# Patient Record
Sex: Male | Born: 1955 | Race: Black or African American | Hispanic: No | Marital: Married | Smoking: Former smoker
Health system: Southern US, Community
[De-identification: ages and names within clinical notes are randomized; demographics above are authoritative.]

## PROBLEM LIST (undated history)

## (undated) DIAGNOSIS — I10 Essential (primary) hypertension: Secondary | ICD-10-CM

## (undated) HISTORY — PX: APPENDECTOMY: SHX54

---

## 1998-12-24 ENCOUNTER — Encounter: Payer: Self-pay | Admitting: Cardiology

## 1998-12-24 ENCOUNTER — Ambulatory Visit (HOSPITAL_COMMUNITY): Admission: RE | Admit: 1998-12-24 | Discharge: 1998-12-24 | Payer: Self-pay | Admitting: Cardiology

## 2001-09-20 ENCOUNTER — Emergency Department (HOSPITAL_COMMUNITY): Admission: EM | Admit: 2001-09-20 | Discharge: 2001-09-21 | Payer: Self-pay | Admitting: Emergency Medicine

## 2001-10-19 ENCOUNTER — Ambulatory Visit: Admission: RE | Admit: 2001-10-19 | Discharge: 2001-10-29 | Payer: Self-pay | Admitting: Radiation Oncology

## 2001-11-02 ENCOUNTER — Emergency Department (HOSPITAL_COMMUNITY): Admission: EM | Admit: 2001-11-02 | Discharge: 2001-11-03 | Payer: Self-pay | Admitting: Emergency Medicine

## 2002-11-07 ENCOUNTER — Ambulatory Visit (HOSPITAL_COMMUNITY): Admission: RE | Admit: 2002-11-07 | Discharge: 2002-11-07 | Payer: Self-pay | Admitting: Family Medicine

## 2002-11-07 ENCOUNTER — Encounter: Payer: Self-pay | Admitting: Family Medicine

## 2002-12-26 ENCOUNTER — Ambulatory Visit (HOSPITAL_COMMUNITY): Admission: RE | Admit: 2002-12-26 | Discharge: 2002-12-26 | Payer: Self-pay | Admitting: *Deleted

## 2005-02-15 ENCOUNTER — Ambulatory Visit: Admission: RE | Admit: 2005-02-15 | Discharge: 2005-04-06 | Payer: Self-pay | Admitting: Radiation Oncology

## 2005-03-17 ENCOUNTER — Encounter: Admission: RE | Admit: 2005-03-17 | Discharge: 2005-03-17 | Payer: Self-pay | Admitting: Surgery

## 2005-03-18 ENCOUNTER — Ambulatory Visit (HOSPITAL_BASED_OUTPATIENT_CLINIC_OR_DEPARTMENT_OTHER): Admission: RE | Admit: 2005-03-18 | Discharge: 2005-03-18 | Payer: Self-pay | Admitting: Surgery

## 2006-04-06 ENCOUNTER — Emergency Department (HOSPITAL_COMMUNITY): Admission: EM | Admit: 2006-04-06 | Discharge: 2006-04-07 | Payer: Self-pay | Admitting: Emergency Medicine

## 2011-01-10 ENCOUNTER — Other Ambulatory Visit: Payer: Self-pay | Admitting: Gastroenterology

## 2011-05-01 ENCOUNTER — Emergency Department (HOSPITAL_BASED_OUTPATIENT_CLINIC_OR_DEPARTMENT_OTHER)
Admission: EM | Admit: 2011-05-01 | Discharge: 2011-05-01 | Disposition: A | Discharge status: Home / Self Care | Payer: BC Managed Care – PPO | Attending: Emergency Medicine | Admitting: Emergency Medicine

## 2011-05-01 ENCOUNTER — Encounter (HOSPITAL_BASED_OUTPATIENT_CLINIC_OR_DEPARTMENT_OTHER): Payer: Self-pay | Admitting: *Deleted

## 2011-05-01 DIAGNOSIS — Z79899 Other long term (current) drug therapy: Secondary | ICD-10-CM | POA: Insufficient documentation

## 2011-05-01 DIAGNOSIS — R10819 Abdominal tenderness, unspecified site: Secondary | ICD-10-CM | POA: Insufficient documentation

## 2011-05-01 DIAGNOSIS — I10 Essential (primary) hypertension: Secondary | ICD-10-CM | POA: Insufficient documentation

## 2011-05-01 DIAGNOSIS — R7401 Elevation of levels of liver transaminase levels: Secondary | ICD-10-CM | POA: Insufficient documentation

## 2011-05-01 DIAGNOSIS — R197 Diarrhea, unspecified: Secondary | ICD-10-CM

## 2011-05-01 DIAGNOSIS — R1915 Other abnormal bowel sounds: Secondary | ICD-10-CM | POA: Insufficient documentation

## 2011-05-01 DIAGNOSIS — R7402 Elevation of levels of lactic acid dehydrogenase (LDH): Secondary | ICD-10-CM | POA: Insufficient documentation

## 2011-05-01 DIAGNOSIS — R109 Unspecified abdominal pain: Secondary | ICD-10-CM | POA: Insufficient documentation

## 2011-05-01 DIAGNOSIS — R748 Abnormal levels of other serum enzymes: Secondary | ICD-10-CM

## 2011-05-01 HISTORY — DX: Essential (primary) hypertension: I10

## 2011-05-01 LAB — COMPREHENSIVE METABOLIC PANEL
Alkaline Phosphatase: 126 U/L — ABNORMAL HIGH (ref 39–117)
BUN: 14 mg/dL (ref 6–23)
Chloride: 101 mEq/L (ref 96–112)
GFR calc Af Amer: >90 mL/min (ref >90)
Glucose, Bld: 111 mg/dL — ABNORMAL HIGH (ref 70–99)
Potassium: 3.6 mEq/L (ref 3.5–5.1)
Total Bilirubin: 0.4 mg/dL (ref 0.3–1.2)

## 2011-05-01 LAB — CLOSTRIDIUM DIFFICILE BY PCR: Toxigenic C. Difficile by PCR: NEGATIVE

## 2011-05-01 LAB — CBC
HCT: 45.4 % (ref 39.0–52.0)
Hemoglobin: 15.4 g/dL (ref 13.0–17.0)
WBC: 4.7 10*3/uL (ref 4.0–10.5)

## 2011-05-01 MED ORDER — SODIUM CHLORIDE 0.9 % IV SOLN: INTRAVENOUS | Status: DC

## 2011-05-01 MED ORDER — HYDROCODONE-ACETAMINOPHEN 5-500 MG PO TABS: 1.0000 | ORAL_TABLET | Freq: Four times a day (QID) | ORAL | Status: AC | PRN

## 2011-05-01 MED ORDER — MORPHINE SULFATE 4 MG/ML IJ SOLN
4.0000 mg | Freq: Once | INTRAMUSCULAR | Status: AC
  Administered 2011-05-01: 4 mg via INTRAVENOUS
  Filled 2011-05-01: qty 1

## 2011-05-01 MED ORDER — SODIUM CHLORIDE 0.9 % IV BOLUS (SEPSIS): 1000.0000 mL | Freq: Once | INTRAVENOUS | Status: DC

## 2011-05-01 MED ORDER — SODIUM CHLORIDE 0.9 % IV BOLUS (SEPSIS)
1000.0000 mL | Freq: Once | INTRAVENOUS | Status: AC
  Administered 2011-05-01: 1000 mL via INTRAVENOUS

## 2011-05-01 MED ORDER — ONDANSETRON HCL 4 MG/2ML IJ SOLN
4.0000 mg | Freq: Once | INTRAMUSCULAR | Status: AC
  Administered 2011-05-01: 4 mg via INTRAVENOUS
  Filled 2011-05-01: qty 2

## 2011-05-01 MED ORDER — LOPERAMIDE HCL 2 MG PO CAPS: 2.0000 mg | ORAL_CAPSULE | Freq: Four times a day (QID) | ORAL | Status: AC | PRN

## 2011-05-01 NOTE — ED Notes (Signed)
Pt presnts to ED today with diarrhea for the last 3 days that has steadily turned to cramping.  Pt has taken pepto about 3 hours ago with no relief in sx.

## 2011-05-01 NOTE — Discharge Instructions (Signed)
Diarrhea  Infections caused by germs (bacterial) or a virus commonly cause diarrhea. Your caregiver has determined that with time, rest and fluids, the diarrhea should improve. In general, eat normally while drinking more water than usual. Although water may prevent dehydration, it does not contain salt and minerals (electrolytes). Broths, weak tea without caffeine and oral rehydration solutions (ORS) replace fluids and electrolytes.  Small amounts of fluids should be taken frequently. Large amounts at one time may not be tolerated. Plain water may be harmful in infants and the elderly. Oral rehydrating solutions (ORS) are available at pharmacies and grocery stores. ORS replace water and important electrolytes in proper proportions. Sports drinks are not as effective as ORS and may be harmful due to sugars worsening diarrhea.  ORS is especially recommended for use in children with diarrhea. As a general guideline for children, replace any new fluid losses from diarrhea and/or vomiting with ORS as follows:  While correcting for dehydration, children should eat normally. However, foods high in sugar should be avoided because this may worsen diarrhea. Large amounts of carbonated soft drinks, juice, gelatin desserts and other highly sugared drinks should be avoided.  After correction of dehydration, other liquids that are appealing to the child may be added. Children should drink small amounts of fluids frequently and fluids should be increased as tolerated. Children should drink enough fluids to keep urine clear or pale yellow.  Adults should eat normally while drinking more fluids than usual. Drink small amounts of fluids frequently and increase as tolerated. Drink enough fluids to keep urine clear or pale yellow. Broths, weak decaffeinated tea, lemon lime soft drinks (allowed to go flat) and ORS replace fluids and electrolytes.  Avoid:  Carbonated drinks.  Juice.  Extremely hot or cold fluids.  Caffeine  drinks.  Fatty, greasy foods.  Alcohol.  Tobacco.  Too much intake of anything at one time.  Gelatin desserts.  Probiotics are active cultures of beneficial bacteria. They may lessen the amount and number of diarrheal stools in adults. Probiotics can be found in yogurt with active cultures and in supplements.  Wash hands well to avoid spreading bacteria and virus.  Anti-diarrheal medications are not recommended for infants and children.  Only take over-the-counter or prescription medicines for pain, discomfort or fever as directed by your caregiver. Do not give aspirin to children because it may cause Reye's Syndrome.  For adults, ask your caregiver if you should continue all prescribed and over-the-counter medicines.  If your caregiver has given you a follow-up appointment, it is very important to keep that appointment. Not keeping the appointment could result in a chronic or permanent injury, and disability. If there is any problem keeping the appointment, you must call back to this facility for assistance.  SEEK IMMEDIATE MEDICAL CARE IF:  You or your child is unable to keep fluids down or other symptoms or problems become worse in spite of treatment.  Vomiting or diarrhea develops and becomes persistent.  There is vomiting of blood or bile (green material).  There is blood in the stool or the stools are black and tarry.  There is no urine output in 6-8 hours or there is only a small amount of very dark urine.  Abdominal pain develops, increases or localizes.  You have a fever.  You or your child develops excessive weakness, dizziness, fainting or extreme thirst.  You or your child develops a rash, stiff neck, severe headache or become irritable or sleepy and difficult to awaken.  MAKE  SURE YOU:  Understand these instructions.  Will watch your condition.  Will get help right away if you are not doing well or get worse.

## 2011-05-01 NOTE — ED Provider Notes (Signed)
History     CSN: [id number]  Arrival date & time 05/01/11  6213   First MD Initiated Contact with Patient 05/01/11 0401      Chief Complaint  Patient presents with  . Diarrhea    (Consider location/radiation/quality/duration/timing/severity/associated sxs/prior treatment) The history is provided by the patient.   diarrhea for last 3 days. No known sick contacts. No recent travel. No recent antibiotics but has been prescribed some the last 2 weeks by a dermatologist, although patient states he did not take them.  No associated fevers or chills. No abdominal pain until today the patient developed cramping which is diffuse and intermittent. No associated nausea vomiting. Moderate severity. No history of same. No history of abdominal surgeries. Illness started with some cough and cold symptoms. No rash. No sore throat. No congestion or runny nose. No history of same  Past Medical History  Diagnosis Date  . Hypertension     History reviewed. No pertinent past surgical history.  No family history on file.  History  Substance Use Topics  . Smoking status: Never Smoker   . Smokeless tobacco: Not on file  . Alcohol Use: No      Review of Systems  Constitutional: Negative for fever and chills.  HENT: Negative for neck pain and neck stiffness.   Eyes: Negative for pain.  Respiratory: Negative for shortness of breath.   Cardiovascular: Negative for chest pain.  Gastrointestinal: Positive for diarrhea. Negative for abdominal pain and blood in stool.  Genitourinary: Negative for dysuria.  Musculoskeletal: Negative for back pain.  Skin: Negative for rash.  Neurological: Negative for headaches.  All other systems reviewed and are negative.    Allergies  Review of patient's allergies indicates no known allergies.  Home Medications   Current Outpatient Rx  Name Route Sig Dispense Refill  . LISINOPRIL-HYDROCHLOROTHIAZIDE 20-25 MG PO TABS Oral Take 1 tablet by mouth daily.    Marland Kitchen  HYDROCODONE-ACETAMINOPHEN 5-500 MG PO TABS Oral Take 1-2 tablets by mouth every 6 (six) hours as needed for pain. 15 tablet 0  . LOPERAMIDE HCL 2 MG PO CAPS Oral Take 1 capsule (2 mg total) by mouth 4 (four) times daily as needed for diarrhea or loose stools. 12 capsule 0    BP 127/81  Pulse 84  Temp(Src) 98.4 F (36.9 C) (Oral)  Resp 16  SpO2 99%  Physical Exam  Constitutional: He is oriented to person, place, and time. He appears well-developed and well-nourished.  HENT:  Head: Normocephalic and atraumatic.       mmm  Eyes: Conjunctivae and EOM are normal. Pupils are equal, round, and reactive to light.  Neck: Trachea normal. Neck supple. No thyromegaly present.  Cardiovascular: Normal rate, regular rhythm, S1 normal, S2 normal and normal pulses.     No systolic murmur is present   No diastolic murmur is present  Pulses:      Radial pulses are 2+ on the right side, and 2+ on the left side.  Pulmonary/Chest: Effort normal and breath sounds normal. He has no wheezes. He has no rhonchi. He has no rales. He exhibits no tenderness.  Abdominal: Soft. Normal appearance. There is no rebound, no guarding, no CVA tenderness and negative Murphy's sign.       Hyperactive bowel sounds with mild diffuse tenderness. No peritonitis  Musculoskeletal:       Gait intact without orthostatic dizziness  Neurological: He is alert and oriented to person, place, and time. He has normal strength. No cranial nerve  deficit or sensory deficit. GCS eye subscore is 4. GCS verbal subscore is 5. GCS motor subscore is 6.  Skin: Skin is warm and dry. No rash noted. He is not diaphoretic.  Psychiatric: His speech is normal.       Cooperative and appropriate    ED Course  Procedures (including critical care time)   Results for orders placed during the hospital encounter of 05/01/11  CBC      Component Value Range   WBC 4.7  4.0 - 10.5 (K/uL)   RBC 5.51  4.22 - 5.81 (MIL/uL)   Hemoglobin 15.4  13.0 - 17.0  (g/dL)   HCT 91.4  78.2 - 95.6 (%)   MCV 82.4  78.0 - 100.0 (fL)   MCH 27.9  26.0 - 34.0 (pg)   MCHC 33.9  30.0 - 36.0 (g/dL)   RDW 21.3  08.6 - 57.8 (%)   Platelets 199  150 - 400 (K/uL)  COMPREHENSIVE METABOLIC PANEL      Component Value Range   Sodium 135  135 - 145 (mEq/L)   Potassium 3.6  3.5 - 5.1 (mEq/L)   Chloride 101  96 - 112 (mEq/L)   CO2 24  19 - 32 (mEq/L)   Glucose, Bld 111 (*) 70 - 99 (mg/dL)   BUN 14  6 - 23 (mg/dL)   Creatinine, Ser 4.69  0.50 - 1.35 (mg/dL)   Calcium 62.9 (*) 8.4 - 10.5 (mg/dL)   Total Protein 8.0  6.0 - 8.3 (g/dL)   Albumin 3.8  3.5 - 5.2 (g/dL)   AST 528 (*) 0 - 37 (U/L)   ALT 106 (*) 0 - 53 (U/L)   Alkaline Phosphatase 126 (*) 39 - 117 (U/L)   Total Bilirubin 0.4  0.3 - 1.2 (mg/dL)   GFR calc non Af Amer 83 (*) >90 (mL/min)   GFR calc Af Amer >90  >90 (mL/min)      1. Diarrhea   2. Elevated liver enzymes    IV fluids. Zofran and morphine. On recheck is feeling much better and requesting to be discharged home. Patient states understanding C. difficile results not back.  He agrees to followup with primary care physician for results and to recheck elevated liver enzymes.   MDM   3 days of diarrheal illness with intermittent abdominal cramping improved with medications as above.  Noted elevated liver enzymes workup. No fevers or persistent abdominal pain. No peritonitis on exam or indication for emergenct imaging at this time. Plan close primary care followup. Pain medications provided. Vital signs within normal range and stable for discharge at this time.        Sunnie Nielsen, MD 05/01/11 [phone]

## 2016-03-04 ENCOUNTER — Emergency Department (HOSPITAL_BASED_OUTPATIENT_CLINIC_OR_DEPARTMENT_OTHER): Payer: BLUE CROSS/BLUE SHIELD

## 2016-03-04 ENCOUNTER — Inpatient Hospital Stay (HOSPITAL_BASED_OUTPATIENT_CLINIC_OR_DEPARTMENT_OTHER)
Admission: EM | Admit: 2016-03-04 | Discharge: 2016-03-06 | DRG: 165 | Disposition: A | Discharge status: Home / Self Care | Payer: BLUE CROSS/BLUE SHIELD | Attending: Pulmonary Disease | Admitting: Pulmonary Disease

## 2016-03-04 ENCOUNTER — Encounter (HOSPITAL_BASED_OUTPATIENT_CLINIC_OR_DEPARTMENT_OTHER): Payer: Self-pay | Admitting: *Deleted

## 2016-03-04 DIAGNOSIS — T17908A Unspecified foreign body in respiratory tract, part unspecified causing other injury, initial encounter: Secondary | ICD-10-CM | POA: Diagnosis not present

## 2016-03-04 DIAGNOSIS — Z79899 Other long term (current) drug therapy: Secondary | ICD-10-CM | POA: Diagnosis not present

## 2016-03-04 DIAGNOSIS — T17508A Unspecified foreign body in bronchus causing other injury, initial encounter: Secondary | ICD-10-CM | POA: Diagnosis not present

## 2016-03-04 DIAGNOSIS — T17898A Other foreign object in other parts of respiratory tract causing other injury, initial encounter: Principal | ICD-10-CM | POA: Diagnosis present

## 2016-03-04 DIAGNOSIS — Y92810 Car as the place of occurrence of the external cause: Secondary | ICD-10-CM

## 2016-03-04 DIAGNOSIS — Z87891 Personal history of nicotine dependence: Secondary | ICD-10-CM | POA: Diagnosis not present

## 2016-03-04 DIAGNOSIS — I10 Essential (primary) hypertension: Secondary | ICD-10-CM | POA: Diagnosis present

## 2016-03-04 DIAGNOSIS — R05 Cough: Secondary | ICD-10-CM | POA: Diagnosis present

## 2016-03-04 DIAGNOSIS — T17908S Unspecified foreign body in respiratory tract, part unspecified causing other injury, sequela: Secondary | ICD-10-CM | POA: Diagnosis not present

## 2016-03-04 DIAGNOSIS — X58XXXA Exposure to other specified factors, initial encounter: Secondary | ICD-10-CM | POA: Diagnosis present

## 2016-03-04 DIAGNOSIS — T17900A Unspecified foreign body in respiratory tract, part unspecified causing asphyxiation, initial encounter: Secondary | ICD-10-CM

## 2016-03-04 LAB — CBC WITH DIFFERENTIAL/PLATELET
BASOS PCT: 1 %
Basophils Absolute: 0 10*3/uL (ref 0.0–0.1)
EOS ABS: 0.3 10*3/uL (ref 0.0–0.7)
Eosinophils Relative: 4 %
HCT: 44.7 % (ref 39.0–52.0)
Hemoglobin: 15.2 g/dL (ref 13.0–17.0)
Lymphocytes Relative: 33 %
Lymphs Abs: 2.1 10*3/uL (ref 0.7–4.0)
MCH: 27.9 pg (ref 26.0–34.0)
MCHC: 34 g/dL (ref 30.0–36.0)
MCV: 82.2 fL (ref 78.0–100.0)
MONO ABS: 0.3 10*3/uL (ref 0.1–1.0)
MONOS PCT: 5 %
Neutro Abs: 3.7 10*3/uL (ref 1.7–7.7)
Neutrophils Relative %: 57 %
Platelets: 214 10*3/uL (ref 150–400)
RBC: 5.44 MIL/uL (ref 4.22–5.81)
RDW: 13.4 % (ref 11.5–15.5)
WBC: 6.4 10*3/uL (ref 4.0–10.5)

## 2016-03-04 LAB — COMPREHENSIVE METABOLIC PANEL
ALBUMIN: 4.3 g/dL (ref 3.5–5.0)
ALT: 29 U/L (ref 17–63)
ANION GAP: 8 (ref 5–15)
AST: 34 U/L (ref 15–41)
Alkaline Phosphatase: 72 U/L (ref 38–126)
BILIRUBIN TOTAL: 0.8 mg/dL (ref 0.3–1.2)
BUN: 13 mg/dL (ref 6–20)
CO2: 22 mmol/L (ref 22–32)
Calcium: 10.5 mg/dL — ABNORMAL HIGH (ref 8.9–10.3)
Chloride: 108 mmol/L (ref 101–111)
Creatinine, Ser: 0.83 mg/dL (ref 0.61–1.24)
GFR calc non Af Amer: >60 mL/min (ref >60)
GLUCOSE: 79 mg/dL (ref 65–99)
POTASSIUM: 3.7 mmol/L (ref 3.5–5.1)
SODIUM: 138 mmol/L (ref 135–145)
Total Protein: 8.3 g/dL — ABNORMAL HIGH (ref 6.5–8.1)

## 2016-03-04 LAB — APTT: APTT: 27 s (ref 24–36)

## 2016-03-04 LAB — PROTIME-INR
INR: 0.98
Prothrombin Time: 13 seconds (ref 11.4–15.2)

## 2016-03-04 NOTE — ED Provider Notes (Signed)
[facility] Provider Note   CSN: [id number] Arrival date & time: 03/04/16  1730     History   Chief Complaint Chief Complaint  Patient presents with  . Swallowed Foreign Body    HPI Joel [name] is a 61 y.o. male.  HPI Patient states that at roughly noon today he had a finishing nail in his mouth and started to cough and accidentally ingested the nail. Denies any pain in his mouth, throat, chest or abdomen. He's had no difficulty breathing or wheezing. Denies any blood in his stool. Past Medical History:  Diagnosis Date  . Hypertension     Patient Active Problem List   Diagnosis Date Noted  . Inhaled foreign object 03/04/2016    Past Surgical History:  Procedure Laterality Date  . APPENDECTOMY         Home Medications    Prior to Admission medications   Medication Sig Start Date End Date Taking? Authorizing Provider  lisinopril-hydrochlorothiazide (PRINZIDE,ZESTORETIC) 20-25 MG per tablet Take 1 tablet by mouth daily.    Historical Provider, MD    Family History No family history on file.  Social History Social History  Substance Use Topics  . Smoking status: Never Smoker  . Smokeless tobacco: Never Used  . Alcohol use No     Allergies   Patient has no known allergies.   Review of Systems Review of Systems  Constitutional: Negative for chills and fever.  HENT: Negative for sore throat, trouble swallowing and voice change.   Respiratory: Negative for cough, shortness of breath and wheezing.   Cardiovascular: Negative for chest pain.  Gastrointestinal: Negative for abdominal pain, nausea and vomiting.  Musculoskeletal: Negative for back pain and neck pain.  Skin: Negative for rash and wound.  Neurological: Negative for dizziness, weakness, light-headedness, numbness and headaches.  All other systems reviewed and are negative.    Physical Exam Updated Vital Signs BP 125/92 (BP Location: Right Arm)   Pulse 60   Temp 98.2 F  (36.8 C) (Oral)   Resp 14   Ht 5\' 10"  (1.778 m)   Wt 270 lb (122.5 kg)   SpO2 96%   BMI 38.74 kg/m   Physical Exam  Constitutional: He is oriented to person, place, and time. He appears well-developed and well-nourished. No distress.  HENT:  Head: Normocephalic and atraumatic.  Mouth/Throat: Oropharynx is clear and moist. No oropharyngeal exudate.  No obvious intraoral or posterior oropharyngeal injury or foreign body noted  Eyes: EOM are normal. Pupils are equal, round, and reactive to light.  Neck: Normal range of motion. Neck supple.  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  Cardiovascular: Normal rate and regular rhythm.  Exam reveals no gallop and no friction rub.   No murmur heard. Pulmonary/Chest: Effort normal and breath sounds normal. No stridor. No respiratory distress. He has no wheezes. He has no rales. He exhibits no tenderness.  Abdominal: Soft. Bowel sounds are normal. There is no tenderness. There is no rebound and no guarding.  Musculoskeletal: Normal range of motion. He exhibits no edema or tenderness.  Neurological: He is alert and oriented to person, place, and time.  Moves all extremities without deficit. Sensation intact.  Skin: Skin is warm and dry. No rash noted. No erythema.  Patient has with multiple nontender soft tissue masses on chest and abdomen. No erythema or warmth.  Psychiatric: He has a normal mood and affect. His behavior is normal.  Nursing note and vitals reviewed.    ED  Treatments / Results  Labs (all labs ordered are listed, but only abnormal results are displayed) Labs Reviewed  CBC WITH DIFFERENTIAL/PLATELET  COMPREHENSIVE METABOLIC PANEL  PROTIME-INR  APTT    EKG  EKG Interpretation None       Radiology Dg Chest 1 View  Result Date: 03/04/2016 CLINICAL DATA:  Accidentally inhaled nail earlier today. Initial encounter. EXAM: CHEST 1 VIEW COMPARISON:  Chest radiograph performed earlier today at 6:11 p.m. FINDINGS: The  metallic nail is seen projecting likely over the bronchus to the right lower lung lobe. No focal airspace consolidation is seen. No pleural effusion or pneumothorax is identified. The heart remains normal in size. No acute osseous abnormalities identified. IMPRESSION: Metallic nail projecting likely over the bronchus to the right lower lung lobe. Electronically Signed   By: Roanna RaiderJeffery  Chang M.D.   On: 03/04/2016 19:41   Dg Abdomen Acute W/chest  Result Date: 03/04/2016 CLINICAL DATA:  Aspiration of a nail. EXAM: DG ABDOMEN ACUTE W/ 1V CHEST COMPARISON:  03/18/2015 FINDINGS: The nail is at the right lung base, within orientation compatible with being either in the right middle lobe or right lower lobe tracheobronchial tree. Thoracic spondylosis. No visible pneumothorax at this time. No pleural effusion. Lumbar spondylosis noted.  Bowel gas pattern unremarkable. IMPRESSION: 1. The nail projects over the right lung base and is either in the right lower lobe or right middle lobe tracheobronchial tree. The lower lobe is more likely. Nail is oriented with the head distally and the tip oriented proximally in the tracheobronchial tree, and there is no current pneumothorax. Bronchoscopy and retrieval is recommended. Electronically Signed   By: Gaylyn RongWalter  Liebkemann M.D.   On: 03/04/2016 18:31    Procedures Procedures (including critical care time)  Medications Ordered in ED Medications - No data to display   Initial Impression / Assessment and Plan / ED Course  I have reviewed the triage vital signs and the nursing notes.  Pertinent labs & imaging results that were available during my care of the patient were reviewed by me and considered in my medical decision making (see chart for details).    X-ray reveals nail and the right lower lung field. Will consult pulmonology.  Discussed with Dr. Vassie LollAlva. Will accept in transfer to MedSurg bed for possible bronchoscopy. Patient remained asymptomatic. Vital signs are  stable. The patient adamant about transferring by private vehicle. Final Clinical Impressions(s) / ED Diagnoses   Final diagnoses:  Inhaled foreign object, initial encounter    New Prescriptions New Prescriptions   No medications on file     Loren Raceravid Vasili Fok, MD 03/04/16 2032

## 2016-03-04 NOTE — ED Notes (Signed)
Per md may leave iv in place for transport by pov

## 2016-03-04 NOTE — ED Notes (Signed)
Patient transported to X-ray 

## 2016-03-04 NOTE — ED Notes (Signed)
Patient had 3cm nickel plated finishing nail in mouth, went to cough and accidentally swallowed nail around 1200 03/04/2016. Pt reports no pain, no trouble breathing. No abdominal pain. Pt is concerned about nail.

## 2016-03-04 NOTE — ED Triage Notes (Signed)
He was holding a finishing nail in his teeth and accidentally swallowed it.

## 2016-03-05 ENCOUNTER — Encounter (HOSPITAL_COMMUNITY): Payer: Self-pay | Admitting: Anesthesiology

## 2016-03-05 ENCOUNTER — Encounter (HOSPITAL_COMMUNITY)
Admission: EM | Disposition: A | Discharge status: Home / Self Care | Payer: Self-pay | Source: Home / Self Care | Attending: Pulmonary Disease

## 2016-03-05 ENCOUNTER — Inpatient Hospital Stay (HOSPITAL_COMMUNITY): Payer: BLUE CROSS/BLUE SHIELD | Admitting: Anesthesiology

## 2016-03-05 DIAGNOSIS — T17908A Unspecified foreign body in respiratory tract, part unspecified causing other injury, initial encounter: Secondary | ICD-10-CM

## 2016-03-05 DIAGNOSIS — T17508A Unspecified foreign body in bronchus causing other injury, initial encounter: Secondary | ICD-10-CM

## 2016-03-05 HISTORY — PX: VIDEO BRONCHOSCOPY: SHX5072

## 2016-03-05 LAB — BASIC METABOLIC PANEL
ANION GAP: 4 — AB (ref 5–15)
BUN: 12 mg/dL (ref 6–20)
CO2: 24 mmol/L (ref 22–32)
Calcium: 10.2 mg/dL (ref 8.9–10.3)
Chloride: 111 mmol/L (ref 101–111)
Creatinine, Ser: 0.89 mg/dL (ref 0.61–1.24)
GFR calc non Af Amer: >60 mL/min (ref >60)
Glucose, Bld: 86 mg/dL (ref 65–99)
Potassium: 3.8 mmol/L (ref 3.5–5.1)
SODIUM: 139 mmol/L (ref 135–145)

## 2016-03-05 LAB — PREPARE RBC (CROSSMATCH)

## 2016-03-05 LAB — CBC
HCT: 43 % (ref 39.0–52.0)
HEMOGLOBIN: 14.2 g/dL (ref 13.0–17.0)
MCH: 27.7 pg (ref 26.0–34.0)
MCHC: 33 g/dL (ref 30.0–36.0)
MCV: 83.8 fL (ref 78.0–100.0)
PLATELETS: 213 10*3/uL (ref 150–400)
RBC: 5.13 MIL/uL (ref 4.22–5.81)
RDW: 13.2 % (ref 11.5–15.5)
WBC: 6.2 10*3/uL (ref 4.0–10.5)

## 2016-03-05 LAB — ABO/RH: ABO/RH(D): B POS

## 2016-03-05 SURGERY — BRONCHOSCOPY, VIDEO-ASSISTED
Anesthesia: General

## 2016-03-05 MED ORDER — PROPOFOL 10 MG/ML IV BOLUS
INTRAVENOUS | Status: AC
  Filled 2016-03-05: qty 40

## 2016-03-05 MED ORDER — LISINOPRIL 40 MG PO TABS: 40.0000 mg | ORAL_TABLET | Freq: Every day | ORAL | Status: DC

## 2016-03-05 MED ORDER — HYDROMORPHONE HCL 1 MG/ML IJ SOLN: 0.2500 mg | INTRAMUSCULAR | Status: DC | PRN

## 2016-03-05 MED ORDER — LACTATED RINGERS IV SOLN
INTRAVENOUS | Status: DC
  Administered 2016-03-05 (×2): via INTRAVENOUS

## 2016-03-05 MED ORDER — SUGAMMADEX SODIUM 200 MG/2ML IV SOLN
INTRAVENOUS | Status: DC | PRN
  Administered 2016-03-05: 200 mg via INTRAVENOUS

## 2016-03-05 MED ORDER — ONDANSETRON HCL 4 MG/2ML IJ SOLN
INTRAMUSCULAR | Status: DC | PRN
  Administered 2016-03-05: 4 mg via INTRAVENOUS

## 2016-03-05 MED ORDER — LIDOCAINE HCL (CARDIAC) 20 MG/ML IV SOLN
INTRAVENOUS | Status: DC | PRN
  Administered 2016-03-05: 100 mg via INTRAVENOUS

## 2016-03-05 MED ORDER — FENTANYL CITRATE (PF) 250 MCG/5ML IJ SOLN
INTRAMUSCULAR | Status: AC
  Filled 2016-03-05: qty 5

## 2016-03-05 MED ORDER — ENOXAPARIN SODIUM 60 MG/0.6ML ~~LOC~~ SOLN: 0.5000 mg/kg | SUBCUTANEOUS | Status: DC

## 2016-03-05 MED ORDER — ROCURONIUM BROMIDE 100 MG/10ML IV SOLN
INTRAVENOUS | Status: DC | PRN
  Administered 2016-03-05: 30 mg via INTRAVENOUS

## 2016-03-05 MED ORDER — ASPIRIN EC 325 MG PO TBEC: 325.0000 mg | DELAYED_RELEASE_TABLET | Freq: Every day | ORAL | Status: DC | PRN

## 2016-03-05 MED ORDER — MIDAZOLAM HCL 2 MG/2ML IJ SOLN
INTRAMUSCULAR | Status: AC
  Filled 2016-03-05: qty 2

## 2016-03-05 MED ORDER — ASPIRIN EC 81 MG PO TBEC: 81.0000 mg | DELAYED_RELEASE_TABLET | Freq: Every day | ORAL | Status: DC

## 2016-03-05 MED ORDER — ADULT MULTIVITAMIN W/MINERALS CH
1.0000 | ORAL_TABLET | Freq: Every day | ORAL | Status: DC
  Administered 2016-03-06: 1 via ORAL
  Filled 2016-03-05: qty 1

## 2016-03-05 MED ORDER — SUCCINYLCHOLINE CHLORIDE 20 MG/ML IJ SOLN
INTRAMUSCULAR | Status: DC | PRN
  Administered 2016-03-05: 140 mg via INTRAVENOUS

## 2016-03-05 MED ORDER — FENTANYL CITRATE (PF) 100 MCG/2ML IJ SOLN
INTRAMUSCULAR | Status: DC | PRN
  Administered 2016-03-05: 100 ug via INTRAVENOUS
  Administered 2016-03-05: 50 ug via INTRAVENOUS

## 2016-03-05 MED ORDER — DEXTROSE-NACL 5-0.2 % IV SOLN
INTRAVENOUS | Status: DC
  Administered 2016-03-05: 12:00:00 via INTRAVENOUS

## 2016-03-05 MED ORDER — LEVOFLOXACIN 750 MG PO TABS
750.0000 mg | ORAL_TABLET | Freq: Every day | ORAL | Status: DC
  Administered 2016-03-05 – 2016-03-06 (×2): 750 mg via ORAL
  Filled 2016-03-05: qty 1

## 2016-03-05 MED ORDER — PROPOFOL 10 MG/ML IV BOLUS
INTRAVENOUS | Status: DC | PRN
  Administered 2016-03-05: 250 mg via INTRAVENOUS

## 2016-03-05 MED ORDER — LEVOFLOXACIN 750 MG PO TABS
750.0000 mg | ORAL_TABLET | Freq: Every day | ORAL | Status: DC
  Filled 2016-03-05: qty 1

## 2016-03-05 MED ORDER — LACTATED RINGERS IV SOLN
INTRAVENOUS | Status: DC
  Administered 2016-03-05: 14:00:00 via INTRAVENOUS

## 2016-03-05 MED ORDER — MIDAZOLAM HCL 5 MG/5ML IJ SOLN
INTRAMUSCULAR | Status: DC | PRN
  Administered 2016-03-05: 2 mg via INTRAVENOUS

## 2016-03-05 SURGICAL SUPPLY — 71 items
ADH SKN CLS APL DERMABOND .7 (GAUZE/BANDAGES/DRESSINGS)
APL SRG 22X2 LUM MLBL SLNT (VASCULAR PRODUCTS)
APPLICATOR TIP EXT COSEAL (VASCULAR PRODUCTS) IMPLANT
BLADE SURG 11 STRL SS (BLADE) ×1 IMPLANT
BRUSH CYTOL CELLEBRITY 1.5X140 (MISCELLANEOUS) IMPLANT
CANISTER SUCTION 2500CC (MISCELLANEOUS) ×3 IMPLANT
CATH KIT ON Q 5IN SLV (PAIN MANAGEMENT) IMPLANT
CATH THORACIC 28FR (CATHETERS) IMPLANT
CATH THORACIC 36FR (CATHETERS) IMPLANT
CATH THORACIC 36FR RT ANG (CATHETERS) IMPLANT
CLIP TI MEDIUM 6 (CLIP) ×1 IMPLANT
CONT SPEC 4OZ CLIKSEAL STRL BL (MISCELLANEOUS) ×4 IMPLANT
COVER SURGICAL LIGHT HANDLE (MISCELLANEOUS) ×2 IMPLANT
COVER TABLE BACK 60X90 (DRAPES) ×1 IMPLANT
DERMABOND ADVANCED (GAUZE/BANDAGES/DRESSINGS)
DERMABOND ADVANCED .7 DNX12 (GAUZE/BANDAGES/DRESSINGS) IMPLANT
DRAIN CHANNEL 28F RND 3/8 FF (WOUND CARE) IMPLANT
DRAPE LAPAROSCOPIC ABDOMINAL (DRAPES) ×1 IMPLANT
DRAPE WARM FLUID 44X44 (DRAPE) ×1 IMPLANT
DRILL BIT 7/64X5 (BIT) IMPLANT
DRSG AQUACEL AG ADV 3.5X14 (GAUZE/BANDAGES/DRESSINGS) ×1 IMPLANT
ELECT REM PT RETURN 9FT ADLT (ELECTROSURGICAL)
ELECTRODE REM PT RTRN 9FT ADLT (ELECTROSURGICAL) ×1 IMPLANT
FORCEPS BIOP RJ4 1.8 (CUTTING FORCEPS) IMPLANT
FORCEPS RADIAL JAW LRG 4 PULM (INSTRUMENTS) ×1 IMPLANT
GAUZE SPONGE 4X4 12PLY STRL (GAUZE/BANDAGES/DRESSINGS) ×1 IMPLANT
GLOVE BIO SURGEON STRL SZ 6.5 (GLOVE) ×6 IMPLANT
GOWN STRL REUS W/ TWL LRG LVL3 (GOWN DISPOSABLE) ×4 IMPLANT
GOWN STRL REUS W/TWL LRG LVL3 (GOWN DISPOSABLE) ×6
KIT BASIN OR (CUSTOM PROCEDURE TRAY) ×1 IMPLANT
KIT CLEAN ENDO COMPLIANCE (KITS) ×3 IMPLANT
KIT ROOM TURNOVER OR (KITS) ×3 IMPLANT
MARKER SKIN DUAL TIP RULER LAB (MISCELLANEOUS) IMPLANT
NDL BIOPSY TRANSBRONCH 21G (NEEDLE) IMPLANT
NEEDLE BIOPSY TRANSBRONCH 21G (NEEDLE) IMPLANT
NS IRRIG 1000ML POUR BTL (IV SOLUTION) ×6 IMPLANT
OIL SILICONE PENTAX (PARTS (SERVICE/REPAIRS)) ×3 IMPLANT
PACK CHEST (CUSTOM PROCEDURE TRAY) ×1 IMPLANT
PAD ARMBOARD 7.5X6 YLW CONV (MISCELLANEOUS) ×4 IMPLANT
PASSER SUT SWANSON 36MM LOOP (INSTRUMENTS) IMPLANT
RADIAL JAW LRG 4 PULMONARY (INSTRUMENTS) ×1
SCISSORS LAP 5X35 DISP (ENDOMECHANICALS) IMPLANT
SEALANT PROGEL (MISCELLANEOUS) IMPLANT
SEALANT SURG COSEAL 4ML (VASCULAR PRODUCTS) IMPLANT
SEALANT SURG COSEAL 8ML (VASCULAR PRODUCTS) IMPLANT
SOLUTION ANTI FOG 6CC (MISCELLANEOUS) ×1 IMPLANT
SUT PROLENE 3 0 SH DA (SUTURE) IMPLANT
SUT PROLENE 4 0 RB 1 (SUTURE)
SUT PROLENE 4-0 RB1 .5 CRCL 36 (SUTURE) IMPLANT
SUT SILK  1 MH (SUTURE)
SUT SILK 1 MH (SUTURE) ×4 IMPLANT
SUT SILK 1 TIES 10X30 (SUTURE) IMPLANT
SUT SILK 2 0SH CR/8 30 (SUTURE) IMPLANT
SUT STEEL 1 (SUTURE) ×2 IMPLANT
SUT VIC AB 1 CTX 18 (SUTURE) IMPLANT
SUT VIC AB 1 CTX 36 (SUTURE)
SUT VIC AB 1 CTX36XBRD ANBCTR (SUTURE) IMPLANT
SUT VIC AB 2-0 CTX 36 (SUTURE) IMPLANT
SUT VIC AB 3-0 X1 27 (SUTURE) ×1 IMPLANT
SUT VICRYL 2 TP 1 (SUTURE) IMPLANT
SYR 20ML ECCENTRIC (SYRINGE) ×3 IMPLANT
SYSTEM SAHARA CHEST DRAIN RE-I (WOUND CARE) ×1 IMPLANT
TAPE CLOTH 4X10 WHT NS (GAUZE/BANDAGES/DRESSINGS) ×3 IMPLANT
TAPE UMBILICAL COTTON 1/8X30 (MISCELLANEOUS) ×3 IMPLANT
TIP APPLICATOR SPRAY EXTEND 16 (VASCULAR PRODUCTS) IMPLANT
TOWEL OR 17X24 6PK STRL BLUE (TOWEL DISPOSABLE) ×4 IMPLANT
TOWEL OR 17X26 10 PK STRL BLUE (TOWEL DISPOSABLE) ×4 IMPLANT
TRAP SPECIMEN MUCOUS 40CC (MISCELLANEOUS) IMPLANT
TRAY FOLEY CATH SILVER 16FR LF (SET/KITS/TRAYS/PACK) ×1 IMPLANT
TUBE CONNECTING 20X1/4 (TUBING) ×1 IMPLANT
WATER STERILE IRR 1000ML POUR (IV SOLUTION) ×4 IMPLANT

## 2016-03-05 NOTE — Anesthesia Postprocedure Evaluation (Signed)
Anesthesia Post Note  Patient: Joel MangesDavid A [name]  Procedure(s) Performed: Procedure(s) (LRB): VIDEO BRONCHOSCOPY with removal of nail (N/A)  Patient location during evaluation: PACU Anesthesia Type: General Level of consciousness: awake Pain management: pain level controlled Respiratory status: spontaneous breathing Cardiovascular status: stable Anesthetic complications: no       Last Vitals:  Vitals:   03/05/16 1612 03/05/16 1703  BP: 107/68 116/65  Pulse: (!) 57 60  Resp: 14   Temp:      Last Pain:  Vitals:   03/05/16 0603  TempSrc: Oral                 [name]

## 2016-03-05 NOTE — Consult Note (Signed)
PULMONARY / CRITICAL CARE MEDICINE   Name: Joel MangesDavid A [name] MRN: [id number] DOB: 06-Jan-1956    ADMISSION DATE:  03/04/2016 CONSULTATION DATE:  @TODAY @   CHIEF COMPLAINT:  Foreign body aspiration  HISTORY OF PRESENT ILLNESS:   Joel [name] is a 61 year old male who was riding in his car this afternoon when he felt something in his teeth. He grabbed a nail, which was sitting in his cup holder. The patient then began to pick his teeth with the nail, when he felt he had to cough. He inhaled and aspirated the nail. He denies respiratory distress or shortness of breath. The patient was transferred for access to bronchoscopy.  He is a former smoker- 20 pack years quit 25 years ago when his daughter was born.   He denies chronic medical problems, and takes lisinopril and baby aspirin.   PAST MEDICAL HISTORY :  He  has a past medical history of Hypertension.  PAST SURGICAL HISTORY: He  has a past surgical history that includes Appendectomy.  No Known Allergies  No current facility-administered medications on file prior to encounter.    Current Outpatient Prescriptions on File Prior to Encounter  Medication Sig  . lisinopril-hydrochlorothiazide (PRINZIDE,ZESTORETIC) 20-25 MG per tablet Take 1 tablet by mouth daily.    FAMILY HISTORY:  His has no family status information on file.    REVIEW OF SYSTEMS:   General: no weight change, no fever, no chills Cardiovascular: no chest pain, palpitations, orthopnea, or PND Respiratory: no cough, sputum production, shortness of breath Gastrointestinal: no nausea, vomiting, diarrhea, or constipation Genitourinary: no pain with urination, no foul odor, no frequency, no urgency Musculoskeletal: no joint pain or swelling, no muscle aches Skin: no rashes, sores, or ulcers Endocrine: no blood sugar problems, no thyroid problems Neurologic: no numbness, tingling, dizziness, or visual changes Hematologic: no bleeding, bruising, or clotting  problems Psychiatric: no depression or anxiety, no suicidal ideation or intent  VITAL SIGNS: BP 137/86 (BP Location: Right Arm)   Pulse 63   Temp 98 F (36.7 C) (Oral)   Resp 17   Ht 5\' 10"  (1.778 m)   Wt 270 lb 15.1 oz (122.9 kg)   SpO2 96%   BMI 38.88 kg/m   INTAKE / OUTPUT: No intake/output data recorded.  PHYSICAL EXAMINATION: Physical Exam: Temp:  [98 F (36.7 C)-98.2 F (36.8 C)] 98 F (36.7 C) (01/26 2300) Pulse Rate:  [60-68] 63 (01/26 2300) Resp:  [14-20] 17 (01/26 2300) BP: (125-137)/(86-92) 137/86 (01/26 2300) SpO2:  [96 %-98 %] 96 % (01/26 2300) Weight:  [270 lb (122.5 kg)-270 lb 15.1 oz (122.9 kg)] 270 lb 15.1 oz (122.9 kg) (01/26 2300)  General Well nourished, well developed, no apparent distress  HEENT No gross abnormalities. Oropharynx clear.   Pulmonary Clear to auscultation bilaterally with no wheezes, rales or ronchi. Good effort, symmetrical expansion.   Cardiovascular Normal rate, regular rhythm. S1, s2. No m/r/g. Distal pulses palpable.  Abdomen Soft, non-tender, non-distended, positive bowel sounds, no palpable organomegaly or masses. Normoresonant to percussion.  Lymphatics No cervical, supraclavicular or axillary adenopathy.   Neurologic Grossly intact. No focal deficits.   Skin/Integuement No rash, no cyanosis, no clubbing.      LABS:  BMET  Recent Labs Lab 03/04/16 2030  NA 138  K 3.7  CL 108  CO2 22  BUN 13  CREATININE 0.83  GLUCOSE 79    Electrolytes  Recent Labs Lab 03/04/16 2030  CALCIUM 10.5*    CBC  Recent Labs  Lab 03/04/16 2030  WBC 6.4  HGB 15.2  HCT 44.7  PLT 214    Coag's  Recent Labs Lab 03/04/16 2030  APTT 27  INR 0.98    Sepsis Markers No results for input(s): LATICACIDVEN, PROCALCITON, O2SATVEN in the last 168 hours.  ABG No results for input(s): PHART, PCO2ART, PO2ART in the last 168 hours.  Liver Enzymes  Recent Labs Lab 03/04/16 2030  AST 34  ALT 29  ALKPHOS 72  BILITOT 0.8   ALBUMIN 4.3    Cardiac Enzymes No results for input(s): TROPONINI, PROBNP in the last 168 hours.  Glucose No results for input(s): GLUCAP in the last 168 hours.  Imaging Dg Chest 1 View  Result Date: 03/04/2016 CLINICAL DATA:  Accidentally inhaled nail earlier today. Initial encounter. EXAM: CHEST 1 VIEW COMPARISON:  Chest radiograph performed earlier today at 6:11 p.m. FINDINGS: The metallic nail is seen projecting likely over the bronchus to the right lower lung lobe. No focal airspace consolidation is seen. No pleural effusion or pneumothorax is identified. The heart remains normal in size. No acute osseous abnormalities identified. IMPRESSION: Metallic nail projecting likely over the bronchus to the right lower lung lobe. Electronically Signed   By: Roanna Raider M.D.   On: 03/04/2016 19:41   Dg Abdomen Acute W/chest  Result Date: 03/04/2016 CLINICAL DATA:  Aspiration of a nail. EXAM: DG ABDOMEN ACUTE W/ 1V CHEST COMPARISON:  03/18/2015 FINDINGS: The nail is at the right lung base, within orientation compatible with being either in the right middle lobe or right lower lobe tracheobronchial tree. Thoracic spondylosis. No visible pneumothorax at this time. No pleural effusion. Lumbar spondylosis noted.  Bowel gas pattern unremarkable. IMPRESSION: 1. The nail projects over the right lung base and is either in the right lower lobe or right middle lobe tracheobronchial tree. The lower lobe is more likely. Nail is oriented with the head distally and the tip oriented proximally in the tracheobronchial tree, and there is no current pneumothorax. Bronchoscopy and retrieval is recommended. Electronically Signed   By: Gaylyn Rong M.D.   On: 03/04/2016 18:31   CULTURES: none  ANTIBIOTICS: Avelox  LINES/TUBES: PIV  DISCUSSION: 61 year old male with HTN presents with foreign body aspiration.   ASSESSMENT / PLAN:  PULMONARY A: Aspiration of nail P:   Patient is hemodynamically  stable on room air. Bronchoscopy will be performed tomorrow for foreign body removal. Discussed case with the patient and his wife, though he will be consented tomorrow.  Should massive hemoptysis occur, will place patient on right side down and selectively intubate and ventilate left side  INFECTIOUS A:  Given the aspiration of foreign body which was sitting in his car, will cover with antibiotics P:   Avelox   FAMILY  - Updates: present at bedside, questions answered.  - Inter-disciplinary family meet or Palliative Care meeting due by:  day 7   The patient is critically ill with multiple organ system failure and requires high complexity decision making for assessment and support, frequent evaluation and titration of therapies, advanced monitoring, review of radiographic studies and interpretation of complex data.   Critical Care Time devoted to patient care services, exclusive of separately billable procedures, described in this note is 30 minutes.   Greta Doom DO Pulmonary and Critical Care Medicine [facility] Pager: [phone]  03/05/2016, 12:15 AM

## 2016-03-05 NOTE — Anesthesia Procedure Notes (Signed)
Procedure Name: Intubation Date/Time: 03/05/2016 2:54 PM Performed by: Edmonia CaprioAUSTON, Tierria Watson M Pre-anesthesia Checklist: Patient identified, Patient being monitored, Timeout performed, Suction available and Emergency Drugs available Patient Re-evaluated:Patient Re-evaluated prior to inductionOxygen Delivery Method: Circle system utilized Preoxygenation: Pre-oxygenation with 100% oxygen Intubation Type: IV induction, Rapid sequence and Cricoid Pressure applied Laryngoscope Size: Glidescope and 3 Grade View: Grade I Tube type: Oral Tube size: 8.5 mm Number of attempts: 1 Airway Equipment and Method: Video-laryngoscopy Placement Confirmation: ETT inserted through vocal cords under direct vision,  positive ETCO2 and breath sounds checked- equal and bilateral Secured at: 22 cm Tube secured with: Tape Dental Injury: Teeth and Oropharynx as per pre-operative assessment

## 2016-03-05 NOTE — Brief Op Note (Signed)
      [address]       [place], [id number]             [phone]    03/05/2016  3:32 PM  PATIENT:  Joel [name]  61 y.o. male  PRE-OPERATIVE DIAGNOSIS:  INHALED FOREIGN OBJECT/nail  POST-OPERATIVE DIAGNOSIS:  INHALED FOREIGN OBJECT/nail  PROCEDURE:  Procedure(s): VIDEO BRONCHOSCOPY with removal of nail (N/A) from right lower lobe   SURGEON:  Surgeon(s) and Role:    * Delight OvensEdward B Gayanne Prescott, MD - Primary   ANESTHESIA:   general  EBL:  Total I/O In: 1000 [I.V.:1000] Out: -   BLOOD ADMINISTERED:none  DRAINS: none   LOCAL MEDICATIONS USED:  NONE  SPECIMEN:  Source of Specimen:  right lower lobe   DISPOSITION OF SPECIMEN:  needle box   COUNTS:  YES   DICTATION: .Dragon Dictation  PLAN OF CARE: already admission  PATIENT DISPOSITION:  PACU - hemodynamically stable.   Delay start of Pharmacological VTE agent (>24hrs) due to surgical blood loss or risk of bleeding: yes

## 2016-03-05 NOTE — Consult Note (Deleted)
PULMONARY / CRITICAL CARE MEDICINE   Name: Joel MangesDavid A Reidinger MRN: [id number] DOB: 1955/12/31    ADMISSION DATE:  03/04/2016 CONSULTATION DATE:   03/04/16   CHIEF COMPLAINT:  Foreign body aspiration  HISTORY OF PRESENT ILLNESS:   Mr. Artis FlockWolfe is a 61 year old male on ACEi with chronic cough  who was riding in his car pm 1/26  when he felt something in his teeth. He grabbed a nail, which was sitting in his cup holder. The patient then began to pick his teeth with the nail, when he felt he had to cough. He inhaled and aspirated the nail. He denied respiratory distress or shortness of breath. The patient was transferred for access to bronchoscopy.  He is a former smoker- 20 pack years quit 25 years ago when his daughter was born.   He denies chronic medical problems, and takes lisinopril and baby aspirin  Subjective/ overnight issues: No cough or cp or sob    VITAL SIGNS: BP 107/76 (BP Location: Right Arm)   Pulse 65   Temp 97.5 F (36.4 C) (Oral)   Resp 18   Ht 5\' 10"  (1.778 m)   Wt 270 lb 15.1 oz (122.9 kg)   SpO2 97%   BMI 38.88 kg/m  02 RA  INTAKE / OUTPUT: I/O last 3 completed shifts: In: 60 [P.O.:60] Out: -   PHYSICAL EXAMINATION: Physical Exam: Temp:  [97.5 F (36.4 C)-98.2 F (36.8 C)] 97.5 F (36.4 C) (01/27 0603) Pulse Rate:  [60-68] 65 (01/27 0603) Resp:  [14-20] 18 (01/27 0603) BP: (107-137)/(76-92) 107/76 (01/27 0603) SpO2:  [96 %-98 %] 97 % (01/27 0603) Weight:  [270 lb (122.5 kg)-270 lb 15.1 oz (122.9 kg)] 270 lb 15.1 oz (122.9 kg) (01/26 2300)    . Wt Readings from Last 3 Encounters:  03/04/16 270 lb 15.1 oz (122.9 kg)    Vital signs reviewed  Pt alert, approp nad  No jvd Oropharynx clear Neck supple Lungs with a few scattered exp > insp rhonchi bilaterally RRR no s3 or or sign murmur Abd obese with excursion >> Extr wam with no edema or clubbing noted Neuro  No motor deficits     LABS:  BMET  Recent Labs Lab 03/04/16 2030 03/05/16 0056   NA 138 139  K 3.7 3.8  CL 108 111  CO2 22 24  BUN 13 12  CREATININE 0.83 0.89  GLUCOSE 79 86    Electrolytes  Recent Labs Lab 03/04/16 2030 03/05/16 0056  CALCIUM 10.5* 10.2    CBC  Recent Labs Lab 03/04/16 2030 03/05/16 0056  WBC 6.4 6.2  HGB 15.2 14.2  HCT 44.7 43.0  PLT 214 213    Coag's  Recent Labs Lab 03/04/16 2030  APTT 27  INR 0.98    Sepsis Markers No results for input(s): LATICACIDVEN, PROCALCITON, O2SATVEN in the last 168 hours.  ABG No results for input(s): PHART, PCO2ART, PO2ART in the last 168 hours.  Liver Enzymes  Recent Labs Lab 03/04/16 2030  AST 34  ALT 29  ALKPHOS 72  BILITOT 0.8  ALBUMIN 4.3    Cardiac Enzymes No results for input(s): TROPONINI, PROBNP in the last 168 hours.  Glucose No results for input(s): GLUCAP in the last 168 hours.  Imaging Dg Chest 1 View  Result Date: 03/04/2016 CLINICAL DATA:  Accidentally inhaled nail earlier today. Initial encounter. EXAM: CHEST 1 VIEW COMPARISON:  Chest radiograph performed earlier today at 6:11 p.m. FINDINGS: The metallic nail is seen projecting likely  over the bronchus to the right lower lung lobe. No focal airspace consolidation is seen. No pleural effusion or pneumothorax is identified. The heart remains normal in size. No acute osseous abnormalities identified. IMPRESSION: Metallic nail projecting likely over the bronchus to the right lower lung lobe. Electronically Signed   By: Roanna RaiderJeffery  Chang M.D.   On: 03/04/2016 19:41   Dg Abdomen Acute W/chest  Result Date: 03/04/2016 CLINICAL DATA:  Aspiration of a nail. EXAM: DG ABDOMEN ACUTE W/ 1V CHEST COMPARISON:  03/18/2015 FINDINGS: The nail is at the right lung base, within orientation compatible with being either in the right middle lobe or right lower lobe tracheobronchial tree. Thoracic spondylosis. No visible pneumothorax at this time. No pleural effusion. Lumbar spondylosis noted.  Bowel gas pattern unremarkable. IMPRESSION:  1. The nail projects over the right lung base and is either in the right lower lobe or right middle lobe tracheobronchial tree. The lower lobe is more likely. Nail is oriented with the head distally and the tip oriented proximally in the tracheobronchial tree, and there is no current pneumothorax. Bronchoscopy and retrieval is recommended. Electronically Signed   By: Gaylyn RongWalter  Liebkemann M.D.   On: 03/04/2016 18:31   CULTURES: none  ANTIBIOTICS: Avelox  LINES/TUBES: PIV  DISCUSSION: 61 year old male with HTN presents with foreign body aspiration = very thin sharp nail with blunt portion lodged in RLL beyond the RML takeoff and sharp tip pointed cephalad.   There is a very high risk of dislodgement and perforation of airway and no good instrument availabe to grab it safely using standard FOB - discussed also with Dr Cherlynn PerchesMcQuaid/ Mannam and over the intercom in OR with DR Tyrone SageGerhardt.  Best approach would be operative FOB with standby equipment if for thoracotomy if can't be safely grabbed/ removed (best equipment would be a rubber lined endobronchial forceps, not metal as this is likely to slip off  as the nail is pulled up   ASSESSMENT / PLAN:  PULMONARY A: Aspiration of nail P:   Patient is hemodynamically stable on room air. Bronchoscopy will be performed tomorrow for foreign body removal. Discussed case with the patient and his wife, though he will be consented tomorrow.  Should massive hemoptysis occur, will place patient on right side down and selectively intubate and ventilate left side  INFECTIOUS A:  Given the aspiration of foreign body which was sitting in his car, will cover with antibiotics P:   Levaquin   Will place on maint IV fluids/ leave off ACEi indefinitely > use ARB instead here.  Discussed in detail all the  indications, usual  risks and alternatives  relative to the benefits with patient/wife who agree  to proceed with rx  as outlined     Sandrea HughsMichael Ameli Sangiovanni, MD Pulmonary and  Critical Care Medicine [facility] Cell [phone] After 5:30 PM or weekends, use Beeper [phone]

## 2016-03-05 NOTE — Consult Note (Signed)
[address]       [place] [address]             [phone]        Joel [name] [place] Medical Record #[id number] Date of Birth: 28-Jul-1955  Referring: Dr Vassie Loll Primary Care: [facility]  Chief Complaint:    Chief Complaint  Patient presents with  . Swallowed Foreign Body    History of Present Illness: The patient is a 61 year old male we are asked to see in thoracic surgical consultation. Yesterday afternoon while riding his car he was using a nail to try to remove something from his teeth. He coughed and aspirated the nail. We are asked to see in consultation by Pulmonary  to attempt to retrieve it via bronchoscopy .   He has no significant chronic medical conditions with the exception of hypertension. He is a former smoker having quit 25 years ago. He has not had any shortness of breath and is essentially asymptomatic.    Current Activity/ Functional Status: Patient is independent with mobility/ambulation, transfers, ADL's, IADL's.   Zubrod Score: At the time of surgery this patient's most appropriate activity status/level should be described as: [x]     0    Normal activity, no symptoms []     1    Restricted in physical strenuous activity but ambulatory, able to do out light work []     2    Ambulatory and capable of self care, unable to do work activities, up and about                 more than 50%  Of the time                            []     3    Only limited self care, in bed greater than 50% of waking hours []     4    Completely disabled, no self care, confined to bed or chair []     5    Moribund  Past Medical History:  Diagnosis Date  . Hypertension     Past Surgical History:  Procedure Laterality Date  . APPENDECTOMY      History  Smoking Status  . Never Smoker  Smokeless Tobacco  . Never Used    History  Alcohol Use No    Social History   Social History  . Marital status: Married    Spouse  name: N/A  . Number of children: N/A  . Years of education: N/A   Occupational History  . Not on file.   Social History Main Topics  . Smoking status: Never Smoker  . Smokeless tobacco: Never Used  . Alcohol use No  . Drug use: No  . Sexual activity: Not on file   Other Topics Concern  . Not on file   Social History Narrative  . No narrative on file    No Known Allergies  Current Facility-Administered Medications  Medication Dose Route Frequency Provider Last Rate Last Dose  . dextrose 5 % and 0.2 % NaCl infusion   Intravenous Continuous Nyoka Cowden, MD 125 mL/hr at 03/05/16 1157    . levofloxacin (LEVAQUIN) tablet 750 mg  750 mg Oral Daily Elveria Rising, DO   750 mg at 03/05/16 2130    Prescriptions Prior to Admission  Medication Sig Dispense Refill Last Dose  . aspirin EC  325 MG tablet Take 325 mg by mouth daily as needed (headache).   week ago  . lisinopril (PRINIVIL,ZESTRIL) 40 MG tablet Take 40 mg by mouth daily.   maybe 1/26 at Unknown time  . Multiple Vitamin (MULTIVITAMIN WITH MINERALS) TABS tablet Take 1 tablet by mouth daily.   maybe 1/26  . Omega-3 Fatty Acids (FISH OIL PO) Take 1 capsule by mouth daily.   maybe 1/26    No family history on file.   Review of Systems:  Pertinent items noted in HPI and remainder of comprehensive ROS otherwise negative.     Cardiac Review of Systems: Y or N  Chest Pain [ n   ]  Resting SOB [  n ] Exertional SOB  [n  ]  Orthopnea [  n]   Pedal Edema [ n  ]    Palpitations [ n ] Syncope  [ n ]   Presyncope [n   ]  General Review of Systems: [Y] = yes [  ]=no Constitional: recent weight change [  ]; anorexia [  ]; fatigue [  ]; nausea [  ]; night sweats [  ]; fever [  ]; or chills [  ]                                                               Dental: poor dentition[  ]; Last Dentist visit:   Eye : blurred vision [  ]; diplopia [   ]; vision changes [  ];  Amaurosis fugax[  ]; Resp: cough [  ];  wheezing[  ];   hemoptysis[  ]; shortness of breath[  ]; paroxysmal nocturnal dyspnea[  ]; dyspnea on exertion[  ]; or orthopnea[  ];  GI:  gallstones[  ], vomiting[  ];  dysphagia[  ]; melena[  ];  hematochezia [  ]; heartburn[  ];   Hx of  Colonoscopy[  ]; GU: kidney stones [  ]; hematuria[  ];   dysuria [  ];  nocturia[  ];  history of     obstruction [  ]; urinary frequency [  ]             Skin: rash, swelling[  ];, hair loss[  ];  peripheral edema[  ];  or itching[  ]; Musculosketetal: myalgias[  ];  joint swelling[  ];  joint erythema[  ];  joint pain[  ];  back pain[  ];  Heme/Lymph: bruising[  ];  bleeding[  ];  anemia[  ];  Neuro: TIA[  ];  headaches[  ];  stroke[  ];  vertigo[  ];  seizures[  ];   paresthesias[  ];  difficulty walking[  ];  Psych:depression[  ]; anxiety[  ];  Endocrine: diabetes[  ];  thyroid dysfunction[  ];  Immunizations: Flu [  ]; Pneumococcal[  ];  Other:  Physical Exam: BP 107/76 (BP Location: Right Arm)   Pulse 65   Temp 97.5 F (36.4 C) (Oral)   Resp 18   Ht 5\' 10"  (1.778 m)   Wt 270 lb 15.1 oz (122.9 kg)   SpO2 97%   BMI 38.88 kg/m    General appearance: alert, cooperative and no distress Head: Normocephalic, without obvious abnormality, atraumatic Neck: no adenopathy, no carotid bruit,  no JVD, supple, symmetrical, trachea midline and thyroid not enlarged, symmetric, no tenderness/mass/nodules Lymph nodes: Cervical, supraclavicular, and axillary nodes normal. Resp: clear to auscultation bilaterally Back: symmetric, no curvature. ROM normal. No CVA tenderness., large keloid upper back Cardio: regular rate and rhythm, S1, S2 normal, no murmur, click, rub or gallop GI: soft, non-tender; bowel sounds normal; no masses,  no organomegaly Extremities: extremities normal, atraumatic, no cyanosis or edema Neurologic: Grossly normal  Diagnostic Studies & Laboratory data:     Recent Radiology Findings:   Dg Chest 1 View  Result Date: 03/04/2016 CLINICAL DATA:   Accidentally inhaled nail earlier today. Initial encounter. EXAM: CHEST 1 VIEW COMPARISON:  Chest radiograph performed earlier today at 6:11 p.m. FINDINGS: The metallic nail is seen projecting likely over the bronchus to the right lower lung lobe. No focal airspace consolidation is seen. No pleural effusion or pneumothorax is identified. The heart remains normal in size. No acute osseous abnormalities identified. IMPRESSION: Metallic nail projecting likely over the bronchus to the right lower lung lobe. Electronically Signed   By: Roanna Raider M.D.   On: 03/04/2016 19:41   Dg Abdomen Acute W/chest  Result Date: 03/04/2016 CLINICAL DATA:  Aspiration of a nail. EXAM: DG ABDOMEN ACUTE W/ 1V CHEST COMPARISON:  03/18/2015 FINDINGS: The nail is at the right lung base, within orientation compatible with being either in the right middle lobe or right lower lobe tracheobronchial tree. Thoracic spondylosis. No visible pneumothorax at this time. No pleural effusion. Lumbar spondylosis noted.  Bowel gas pattern unremarkable. IMPRESSION: 1. The nail projects over the right lung base and is either in the right lower lobe or right middle lobe tracheobronchial tree. The lower lobe is more likely. Nail is oriented with the head distally and the tip oriented proximally in the tracheobronchial tree, and there is no current pneumothorax. Bronchoscopy and retrieval is recommended. Electronically Signed   By: Gaylyn Rong M.D.   On: 03/04/2016 18:31     I have independently reviewed the above radiologic studies.  Recent Lab Findings: Lab Results  Component Value Date   WBC 6.2 03/05/2016   HGB 14.2 03/05/2016   HCT 43.0 03/05/2016   PLT 213 03/05/2016   GLUCOSE 86 03/05/2016   ALT 29 03/04/2016   AST 34 03/04/2016   NA 139 03/05/2016   K 3.8 03/05/2016   CL 111 03/05/2016   CREATININE 0.89 03/05/2016   BUN 12 03/05/2016   CO2 24 03/05/2016   INR 0.98 03/04/2016      Assessment / Plan:    Nail  aspiration (foreign body), will attempt to remove via bronchoscopy      GOLD,WAYNE E, PA-C 03/05/2016 12:05 PM  xrays reviewed  I have seen and examined Joel [name] and agree with the above assessment  and plan.  I have discussed with the patient attempt to remove FB form bronchus .  The goals risks and alternatives of the planned surgical procedure Bronchoscopy removal of FB , poss thoracotomy   have been discussed with the patient in detail. The risks of the procedure including death, infection, stroke, myocardial infarction, bleeding, blood transfusion have all been discussed specifically. We have discussed possibility can not be removed via scope  I have quoted Joel [name] a 1 % of perioperative mortality and a complication rate as high as 25 %. The patient's questions have been answered.Joel [name] is willing  to proceed with the planned procedure.  Delight Ovens MD Beeper [phone] Office [phone]  03/05/2016 2:11 PM

## 2016-03-05 NOTE — Progress Notes (Signed)
PULMONARY / CRITICAL CARE MEDICINE   Name: Joel MangesDavid A Reidinger MRN: [id number] DOB: 1955/12/31    ADMISSION DATE:  03/04/2016 CONSULTATION DATE:   03/04/16   CHIEF COMPLAINT:  Foreign body aspiration  HISTORY OF PRESENT ILLNESS:   Mr. Artis FlockWolfe is a 61 year old male on ACEi with chronic cough  who was riding in his car pm 1/26  when he felt something in his teeth. He grabbed a nail, which was sitting in his cup holder. The patient then began to pick his teeth with the nail, when he felt he had to cough. He inhaled and aspirated the nail. He denied respiratory distress or shortness of breath. The patient was transferred for access to bronchoscopy.  He is a former smoker- 20 pack years quit 25 years ago when his daughter was born.   He denies chronic medical problems, and takes lisinopril and baby aspirin  Subjective/ overnight issues: No cough or cp or sob    VITAL SIGNS: BP 107/76 (BP Location: Right Arm)   Pulse 65   Temp 97.5 F (36.4 C) (Oral)   Resp 18   Ht 5\' 10"  (1.778 m)   Wt 270 lb 15.1 oz (122.9 kg)   SpO2 97%   BMI 38.88 kg/m  02 RA  INTAKE / OUTPUT: I/O last 3 completed shifts: In: 60 [P.O.:60] Out: -   PHYSICAL EXAMINATION: Physical Exam: Temp:  [97.5 F (36.4 C)-98.2 F (36.8 C)] 97.5 F (36.4 C) (01/27 0603) Pulse Rate:  [60-68] 65 (01/27 0603) Resp:  [14-20] 18 (01/27 0603) BP: (107-137)/(76-92) 107/76 (01/27 0603) SpO2:  [96 %-98 %] 97 % (01/27 0603) Weight:  [270 lb (122.5 kg)-270 lb 15.1 oz (122.9 kg)] 270 lb 15.1 oz (122.9 kg) (01/26 2300)    . Wt Readings from Last 3 Encounters:  03/04/16 270 lb 15.1 oz (122.9 kg)    Vital signs reviewed  Pt alert, approp nad  No jvd Oropharynx clear Neck supple Lungs with a few scattered exp > insp rhonchi bilaterally RRR no s3 or or sign murmur Abd obese with excursion >> Extr wam with no edema or clubbing noted Neuro  No motor deficits     LABS:  BMET  Recent Labs Lab 03/04/16 2030 03/05/16 0056   NA 138 139  K 3.7 3.8  CL 108 111  CO2 22 24  BUN 13 12  CREATININE 0.83 0.89  GLUCOSE 79 86    Electrolytes  Recent Labs Lab 03/04/16 2030 03/05/16 0056  CALCIUM 10.5* 10.2    CBC  Recent Labs Lab 03/04/16 2030 03/05/16 0056  WBC 6.4 6.2  HGB 15.2 14.2  HCT 44.7 43.0  PLT 214 213    Coag's  Recent Labs Lab 03/04/16 2030  APTT 27  INR 0.98    Sepsis Markers No results for input(s): LATICACIDVEN, PROCALCITON, O2SATVEN in the last 168 hours.  ABG No results for input(s): PHART, PCO2ART, PO2ART in the last 168 hours.  Liver Enzymes  Recent Labs Lab 03/04/16 2030  AST 34  ALT 29  ALKPHOS 72  BILITOT 0.8  ALBUMIN 4.3    Cardiac Enzymes No results for input(s): TROPONINI, PROBNP in the last 168 hours.  Glucose No results for input(s): GLUCAP in the last 168 hours.  Imaging Dg Chest 1 View  Result Date: 03/04/2016 CLINICAL DATA:  Accidentally inhaled nail earlier today. Initial encounter. EXAM: CHEST 1 VIEW COMPARISON:  Chest radiograph performed earlier today at 6:11 p.m. FINDINGS: The metallic nail is seen projecting likely  over the bronchus to the right lower lung lobe. No focal airspace consolidation is seen. No pleural effusion or pneumothorax is identified. The heart remains normal in size. No acute osseous abnormalities identified. IMPRESSION: Metallic nail projecting likely over the bronchus to the right lower lung lobe. Electronically Signed   By: Roanna Raider M.D.   On: 03/04/2016 19:41   Dg Abdomen Acute W/chest  Result Date: 03/04/2016 CLINICAL DATA:  Aspiration of a nail. EXAM: DG ABDOMEN ACUTE W/ 1V CHEST COMPARISON:  03/18/2015 FINDINGS: The nail is at the right lung base, within orientation compatible with being either in the right middle lobe or right lower lobe tracheobronchial tree. Thoracic spondylosis. No visible pneumothorax at this time. No pleural effusion. Lumbar spondylosis noted.  Bowel gas pattern unremarkable. IMPRESSION:  1. The nail projects over the right lung base and is either in the right lower lobe or right middle lobe tracheobronchial tree. The lower lobe is more likely. Nail is oriented with the head distally and the tip oriented proximally in the tracheobronchial tree, and there is no current pneumothorax. Bronchoscopy and retrieval is recommended. Electronically Signed   By: Gaylyn Rong M.D.   On: 03/04/2016 18:31   CULTURES: none  ANTIBIOTICS: Avelox  LINES/TUBES: PIV  DISCUSSION: 61 year old male with HTN presents with foreign body aspiration = very thin sharp nail with blunt portion lodged in RLL beyond the RML takeoff and sharp tip pointed cephalad.   There is a very high risk of dislodgement and perforation of airway and no good instrument availabe to grab it safely using standard FOB - discussed also with Dr Cherlynn Perches and over the intercom in OR with DR Tyrone Sage.  Best approach would be operative FOB with standby equipment if for thoracotomy if can't be safely grabbed/ removed (best equipment would be a rubber lined endobronchial forceps, not metal as this is likely to slip off  as the nail is pulled up   ASSESSMENT / PLAN:  PULMONARY A: Aspiration of nail P:   Patient is comfortable/ assymtomatic on room air. Bronchoscopy will be performed tomorrow for foreign body removal. Discussed case with the patient and his wife, though he will be consented tomorrow.  Should massive hemoptysis occur, will place patient on right side down and selectively intubate and ventilate left side  INFECTIOUS A:  Given the aspiration of foreign body which was sitting in his car, will cover with antibiotics P:   Levaquin   Cardiovascular  Bp fine off ACEi but he is npo   Will place on maint IV fluids/ leave off ACEi indefinitely > use ARB instead here/ stop asa for now also.  Discussed in detail all the  indications, usual  risks and alternatives  relative to the benefits with patient/wife who  agree  to proceed with rx  as outlined     Sandrea Hughs, MD Pulmonary and Critical Care Medicine [facility] Cell [phone] After 5:30 PM or weekends, use Beeper [phone]

## 2016-03-05 NOTE — Transfer of Care (Signed)
Immediate Anesthesia Transfer of Care Note  Patient: Joel [name]  Procedure(s) Performed: Procedure(s): VIDEO BRONCHOSCOPY with removal of nail (N/A)  Patient Location: PACU  Anesthesia Type:General  Level of Consciousness: awake  Airway & Oxygen Therapy: Patient Spontanous Breathing and Patient connected to face mask oxygen  Post-op Assessment: Report given to RN and Post -op Vital signs reviewed and stable  Post vital signs: Reviewed and stable  Last Vitals:  Vitals:   03/04/16 2300 03/05/16 0603  BP: 137/86 107/76  Pulse: 63 65  Resp: 17 18  Temp: 36.7 C 36.4 C    Last Pain:  Vitals:   03/05/16 0603  TempSrc: Oral         Complications: No apparent anesthesia complications

## 2016-03-05 NOTE — Anesthesia Preprocedure Evaluation (Addendum)
Anesthesia Evaluation  Patient identified by MRN, date of birth, ID band Patient awake    Reviewed: Allergy & Precautions, NPO status , Patient's Chart, lab work & pertinent test results  Airway Mallampati: II  TM Distance: >3 FB     Dental   Pulmonary  History noted. CG   breath sounds clear to auscultation       Cardiovascular hypertension,  Rhythm:Regular Rate:Normal     Neuro/Psych    GI/Hepatic negative GI ROS, Neg liver ROS,   Endo/Other  negative endocrine ROS  Renal/GU negative Renal ROS     Musculoskeletal   Abdominal   Peds  Hematology   Anesthesia Other Findings   Reproductive/Obstetrics                            Anesthesia Physical Anesthesia Plan  ASA: III and emergent  Anesthesia Plan: General   Post-op Pain Management:    Induction: Intravenous  Airway Management Planned: Double Lumen EBT  Additional Equipment:   Intra-op Plan:   Post-operative Plan: Possible Post-op intubation/ventilation  Informed Consent:   Dental advisory given  Plan Discussed with: CRNA, Anesthesiologist and Surgeon  Anesthesia Plan Comments:        Anesthesia Quick Evaluation

## 2016-03-06 ENCOUNTER — Encounter (HOSPITAL_COMMUNITY): Payer: Self-pay | Admitting: Cardiothoracic Surgery

## 2016-03-06 ENCOUNTER — Inpatient Hospital Stay (HOSPITAL_COMMUNITY): Payer: BLUE CROSS/BLUE SHIELD

## 2016-03-06 DIAGNOSIS — T17908S Unspecified foreign body in respiratory tract, part unspecified causing other injury, sequela: Secondary | ICD-10-CM

## 2016-03-06 MED ORDER — VALSARTAN 160 MG PO TABS: 160.0000 mg | ORAL_TABLET | Freq: Every day | ORAL | 3 refills | Status: AC

## 2016-03-06 NOTE — Discharge Summary (Signed)
Physician Discharge Summary  Patient ID: Joel [name] MRN: [id number] DOB/AGE: 61/61/1957 61 y.o.  Admit date: 03/04/2016 Discharge date: 03/06/2016    Discharge Diagnoses:  Aspiration of Foreign Object (Nail) Chronic Cough  HTN                                                                      DISCHARGE PLAN BY DIAGNOSIS     Aspiration of Foreign Object (Nail)  Discharge Plan: No acute follow up necessary.   Patient can follow up with Dr. Servando Snare PRN D/C abx at discharge.  No further abx at this time.   Chronic Cough   Discharge Plan: Discontinued ACE-I while inpatient  Discharge on Diovan 160 mg QD   HTN   Discharge Plan: Diovan 160 mg QD  Follow up with PCP in one week for blood pressure review                  DISCHARGE SUMMARY   Joel [name] is a 61 y.o. y/o male with a PMH of HTN and cough who presented to [facility] on 1/26 after aspirating a finishing nail.  The patient reports he was cleaning his teeth in his car and he began to cough accidentally ingesting the nail.   On presentation, he denied difficulty with shortness of breath or chest pain.  CXR demonstrated a nail in the RLL of the lung with sharp end pointing up.  PCCM consulted for admission. The patient was admitted and assessed for possible bronchoscopy.  He was empirically treated with levaquin for aspiration event.  Decision was made to perform bronchoscopy under controlled conditions.  CVTS consulted and patient was taken to the OR on 1/27 with removal of nail via video bronchoscopy.  The patient tolerated the procedure well and had no further acute issues.  Incidentally, he reported chronic cough and home ACE-I was changed to diovan at discharge.  The patient was medically cleared for discharge on 1/28 with plans as above.          SIGNIFICANT EVENTS:  1/26  Admit after aspiration of nail  1/27  OR for Video Bronchoscopy with removal of nail from the RLL  ANTIBIOTICS Levaquin  (empiric) 1/26 >> 1/28   CONSULTS CVTS - Dr. Servando Snare    Discharge Exam: General: obese male in NAD Neuro: AAOx4, no motor deficits CV: s1s2 rrr PULM: non-labored, coarse bilaterally GI: obese/soft, tolerating PO's Extremities: warm/dry, no edema  Vitals:   03/05/16 1612 03/05/16 1703 03/05/16 2157 03/06/16 0556  BP: 107/68 116/65 104/65 104/71  Pulse: (!) 57 60 62 (!) 54  Resp: 14  18 18   Temp:   98.1 F (36.7 C) 97.5 F (36.4 C)  TempSrc:   Oral Oral  SpO2: 93% 97% 96% 97%  Weight:      Height:         Discharge Labs  BMET  Recent Labs Lab 03/04/16 2030 03/05/16 0056  NA 138 139  K 3.7 3.8  CL 108 111  CO2 22 24  GLUCOSE 79 86  BUN 13 12  CREATININE 0.83 0.89  CALCIUM 10.5* 10.2    CBC  Recent Labs Lab 03/04/16 2030 03/05/16 0056  HGB 15.2 14.2  HCT 44.7 43.0  WBC 6.4 6.2  PLT 214 213    Anti-Coagulation  Recent Labs Lab 03/04/16 2030  INR 0.98    Discharge Instructions    Call MD for:  difficulty breathing, headache or visual disturbances    Complete by:  As directed    Call MD for:  extreme fatigue    Complete by:  As directed    Call MD for:  hives    Complete by:  As directed    Call MD for:  persistant dizziness or light-headedness    Complete by:  As directed    Call MD for:  persistant nausea and vomiting    Complete by:  As directed    Call MD for:  severe uncontrolled pain    Complete by:  As directed    Call MD for:  temperature >100.4    Complete by:  As directed    Diet - low sodium heart healthy    Complete by:  As directed    Discharge instructions    Complete by:  As directed    1.  Review your medications carefully as they have changed 2.  STOP taking your lisinopril 3.  Call Monday AM to your primary care MD to be seen within one week to have your blood pressure checked.  4. Return to the ER if you have new or worsening symptoms.   Increase activity slowly    Complete by:  As directed        Follow-up  Information    [facility] Follow up.   Specialty:  Family Medicine Why:  Call for follow up appointment to be seen within one week.  Your blood pressure needs to be reviewed on your new medication. Contact information: [place] [address] [phone]            Allergies as of 03/06/2016   No Known Allergies     Medication List    STOP taking these medications   lisinopril 40 MG tablet Commonly known as:  PRINIVIL,ZESTRIL     TAKE these medications   aspirin EC 325 MG tablet Take 325 mg by mouth daily as needed (headache).   FISH OIL PO Take 1 capsule by mouth daily.   multivitamin with minerals Tabs tablet Take 1 tablet by mouth daily.   valsartan 160 MG tablet Commonly known as:  DIOVAN Take 1 tablet (160 mg total) by mouth daily.         Disposition:  Home.  No new home health needs identified prior to discharge.   Discharged Condition: Joel [name] has met maximum benefit of inpatient care and is medically stable and cleared for discharge. has met maximum benefit of inpatient care and is medically stable and cleared for discharge.  Patient is pending follow up as above.      Time spent on disposition:  36 minutes. .   Signed: Noe Gens, NP-C [facility] Pgr: [phone] Office: [phone]  Patient seen and examined and cxr reviewed s/p extraction of very sharp nail RLL by T surgery by FOB. No evidence of aspiration of anything else and no evidence of infection so does not need abx.  However, strongly advised no ACEi and further w/u of chronic cough and f/u bp either by [facility] or our clinic - I directly participated in discharge planning   Christinia Gully, MD Pulmonary and [place] [phone] After 5:30 PM or weekends, use [facility]per [phone]

## 2016-03-06 NOTE — Progress Notes (Signed)
[address]       [place] [id number]             [phone]      1 Day Post-Op Procedure(s) (LRB): VIDEO BRONCHOSCOPY with removal of nail (N/A) Subjective: Looks and feels well  Objective: Vital signs in last 24 hours: Temp:  [97.5 F (36.4 C)-98.1 F (36.7 C)] 97.5 F (36.4 C) (01/28 0556) Pulse Rate:  [54-104] 54 (01/28 0556) Cardiac Rhythm: Normal sinus rhythm (01/27 1543) Resp:  [10-18] 18 (01/28 0556) BP: (104-120)/(65-75) 104/71 (01/28 0556) SpO2:  [92 %-97 %] 97 % (01/28 0556)  Hemodynamic parameters for last 24 hours:    Intake/Output from previous day: 01/27 0701 - 01/28 0700 In: 1480.7 [P.O.:200; I.V.:1280.7] Out: 0  Intake/Output this shift: Total I/O In: 360 [P.O.:360] Out: -   General appearance: alert, cooperative and no distress Heart: regular rate and rhythm Lungs: clear to auscultation bilaterally  Lab Results:  Recent Labs  03/04/16 2030 03/05/16 0056  WBC 6.4 6.2  HGB 15.2 14.2  HCT 44.7 43.0  PLT 214 213   BMET:  Recent Labs  03/04/16 2030 03/05/16 0056  NA 138 139  K 3.7 3.8  CL 108 111  CO2 22 24  GLUCOSE 79 86  BUN 13 12  CREATININE 0.83 0.89  CALCIUM 10.5* 10.2    PT/INR:  Recent Labs  03/04/16 2030  LABPROT 13.0  INR 0.98   ABG No results found for: PHART, HCO3, TCO2, ACIDBASEDEF, O2SAT CBG (last 3)  No results for input(s): GLUCAP in the last 72 hours.  Meds Scheduled Meds: . levofloxacin  750 mg Oral Daily  . multivitamin with minerals  1 tablet Oral Daily   Continuous Infusions: . lactated ringers Stopped (03/06/16 0641)   PRN Meds:.aspirin EC  Xrays Dg Chest 1 View  Result Date: 03/04/2016 CLINICAL DATA:  Accidentally inhaled nail earlier today. Initial encounter. EXAM: CHEST 1 VIEW COMPARISON:  Chest radiograph performed earlier today at 6:11 p.m. FINDINGS: The metallic nail is seen projecting likely over the bronchus to the right lower lung lobe. No focal airspace  consolidation is seen. No pleural effusion or pneumothorax is identified. The heart remains normal in size. No acute osseous abnormalities identified. IMPRESSION: Metallic nail projecting likely over the bronchus to the right lower lung lobe. Electronically Signed   By: Roanna RaiderJeffery  Chang M.D.   On: 03/04/2016 19:41   Dg Chest 2 View  Result Date: 03/06/2016 CLINICAL DATA:  Post removal of aspirated nail EXAM: CHEST  2 VIEW COMPARISON:  03/04/2016 FINDINGS: Interval removal of the nail projecting over the right lower lung. No pneumothorax. No confluent opacities. Heart is normal size. No effusions. IMPRESSION: Interval removal of aspirated nail.  No acute findings. Electronically Signed   By: Charlett NoseKevin  Dover M.D.   On: 03/06/2016 07:47   Dg Abdomen Acute W/chest  Result Date: 03/04/2016 CLINICAL DATA:  Aspiration of a nail. EXAM: DG ABDOMEN ACUTE W/ 1V CHEST COMPARISON:  03/18/2015 FINDINGS: The nail is at the right lung base, within orientation compatible with being either in the right middle lobe or right lower lobe tracheobronchial tree. Thoracic spondylosis. No visible pneumothorax at this time. No pleural effusion. Lumbar spondylosis noted.  Bowel gas pattern unremarkable. IMPRESSION: 1. The nail projects over the right lung base and is either in the right lower lobe or right middle lobe tracheobronchial tree. The lower lobe is more likely. Nail is oriented with the head distally and the  tip oriented proximally in the tracheobronchial tree, and there is no current pneumothorax. Bronchoscopy and retrieval is recommended. Electronically Signed   By: Gaylyn Rong M.D.   On: 03/04/2016 18:31    Assessment/Plan: S/P Procedure(s) (LRB): VIDEO BRONCHOSCOPY with removal of nail (N/A)  1 doing well 2 CXR looks fine 3 discharge per primary service. F/U with Dr Tyrone Sage prn   LOS: 2 days    GOLD,WAYNE E 03/06/2016

## 2016-03-06 NOTE — Progress Notes (Signed)
NURSING PROGRESS NOTE  Joel MangesDavid A [name] [id number] Discharge Data: 03/06/2016 12:43 PM Attending Provider: Oretha Milchakesh Alva V, MD WUJ:[id number]:[facility]     Joel Mangesavid A Edmister to be D/C'd Home per MD order.  Discussed with the patient the After Visit Summary and all questions fully answered. All IV's discontinued with no bleeding noted. All belongings returned to patient for patient to take home.   Last Vital Signs:  Blood pressure 104/71, pulse (!) 54, temperature 97.5 F (36.4 C), temperature source Oral, resp. rate 18, height 5\' 10"  (1.778 m), weight 122.9 kg (270 lb 15.1 oz), SpO2 97 %.  Discharge Medication List Allergies as of 03/06/2016   No Known Allergies     Medication List    STOP taking these medications   lisinopril 40 MG tablet Commonly known as:  PRINIVIL,ZESTRIL     TAKE these medications   aspirin EC 325 MG tablet Take 325 mg by mouth daily as needed (headache).   FISH OIL PO Take 1 capsule by mouth daily.   multivitamin with minerals Tabs tablet Take 1 tablet by mouth daily.   valsartan 160 MG tablet Commonly known as:  DIOVAN Take 1 tablet (160 mg total) by mouth daily.

## 2016-03-07 NOTE — Op Note (Signed)
[name]:  [name], Joel [name]                 ACCOUNT NO.:  [id number]  MEDICAL RECORD NO.:  [id number]  LOCATION:  [id number]                        FACILITY:  [facility]  PHYSICIAN:  Sheliah PlaneEdward Aaric Dolph, MD    DATE OF BIRTH:  April 27, 1955  DATE OF PROCEDURE:  03/05/2016 DATE OF DISCHARGE:                              OPERATIVE REPORT   PREOPERATIVE DIAGNOSIS:  Inhaled foreign object, nail.  POSTOPERATIVE DIAGNOSIS:  Inhaled foreign object, nail.  SURGICAL PROCEDURE:  Urgent video bronchoscopy with removal of foreign object from the right lower lobe.  SURGEON:  Sheliah PlaneEdward Kaileen Bronkema, MD  BRIEF HISTORY:  The patient is a 61 year old male who the evening before was using a small nail as a toothpick when he suddenly coughed or sneezed and inhaled the nail.  He went to an outside emergency room. Chest x-ray showed a nail in the right lower lobe bronchus.  The patient was stable and in no distress.  He was [facility] to the Pulmonary Service for bronchoscopy.  After consultation, they requested that Thoracic Surgery proceed with removal of the nail.  Risks and options were discussed with the patient, and we proceeded with video bronchoscopy.  DESCRIPTION OF THE PROCEDURE:  The patient underwent general endotracheal anesthesia by Dr. Randa EvensEdwards with a single-lumen endotracheal tube, which he tolerated without difficulty.  We had fluoroscopy set up in the room should it be necessary.  After appropriate time-out was performed, a 2.8 mm video bronchoscope was used and placed through the endotracheal tube.  There was no abnormality in the main trachea.  We first looked into the left main stem bronchus, the left lower lobe, and the left upper lobe where there were no abnormalities or secretions.  We then proceeded with careful examination of the right tracheobronchial tree, the takeoff of the right upper lobe bronchus was clear, the middle lobe bronchus was also clear.  As we moved to the lower lobe  bronchus in the medial segment, the initial flash of foreign body was noted.  It was somewhat covered with mucus.  Our first attempt was to attempt to remove this with the large biopsy forceps.  The biopsy forceps were then passed down the fiberoptic bronchoscope and with careful coordination with anesthesia on ventilation, we were able to nudge the nail more toward the center of the bronchus and we were able to grab it with the biopsy forceps and extract it through the endotracheal tube as we pulled out of the endotracheal tube, the endotracheal tube was clamped to prevent loss of the nail back into the tracheobronchial tree.  The nail was removed in its entirety.  Completion bronchoscopy of the lower lobe and the right lung was completed, minor secretions were suctioned and cleared, scope was removed.  The patient was then extubated in the operating room and transferred to the recovery room for postoperative observation having tolerated the procedure without complications.  The nail was photographed and saved to the media tab in epic.  The nail was then disposed of in the needle box.     Sheliah PlaneEdward Zona Pedro, MD     EG/MEDQ  D:  03/06/2016  T:  03/07/2016  Job:  [id number]

## 2016-03-09 LAB — TYPE AND SCREEN
ABO/RH(D): B POS
Antibody Screen: NEGATIVE
Unit division: 0
Unit division: 0

## 2018-07-30 ENCOUNTER — Other Ambulatory Visit: Payer: Self-pay | Admitting: *Deleted

## 2018-07-30 DIAGNOSIS — Z20822 Contact with and (suspected) exposure to covid-19: Secondary | ICD-10-CM

## 2018-08-05 LAB — NOVEL CORONAVIRUS, NAA: SARS-CoV-2, NAA: NOT DETECTED

## 2018-08-22 IMAGING — CR DG CHEST 2V
2 series · 2 of 2 positions shown · non-contrast
Comparison: 03/04/2016

CLINICAL DATA: Post removal of aspirated nail

EXAM:
CHEST  2 VIEW

[chest pa]
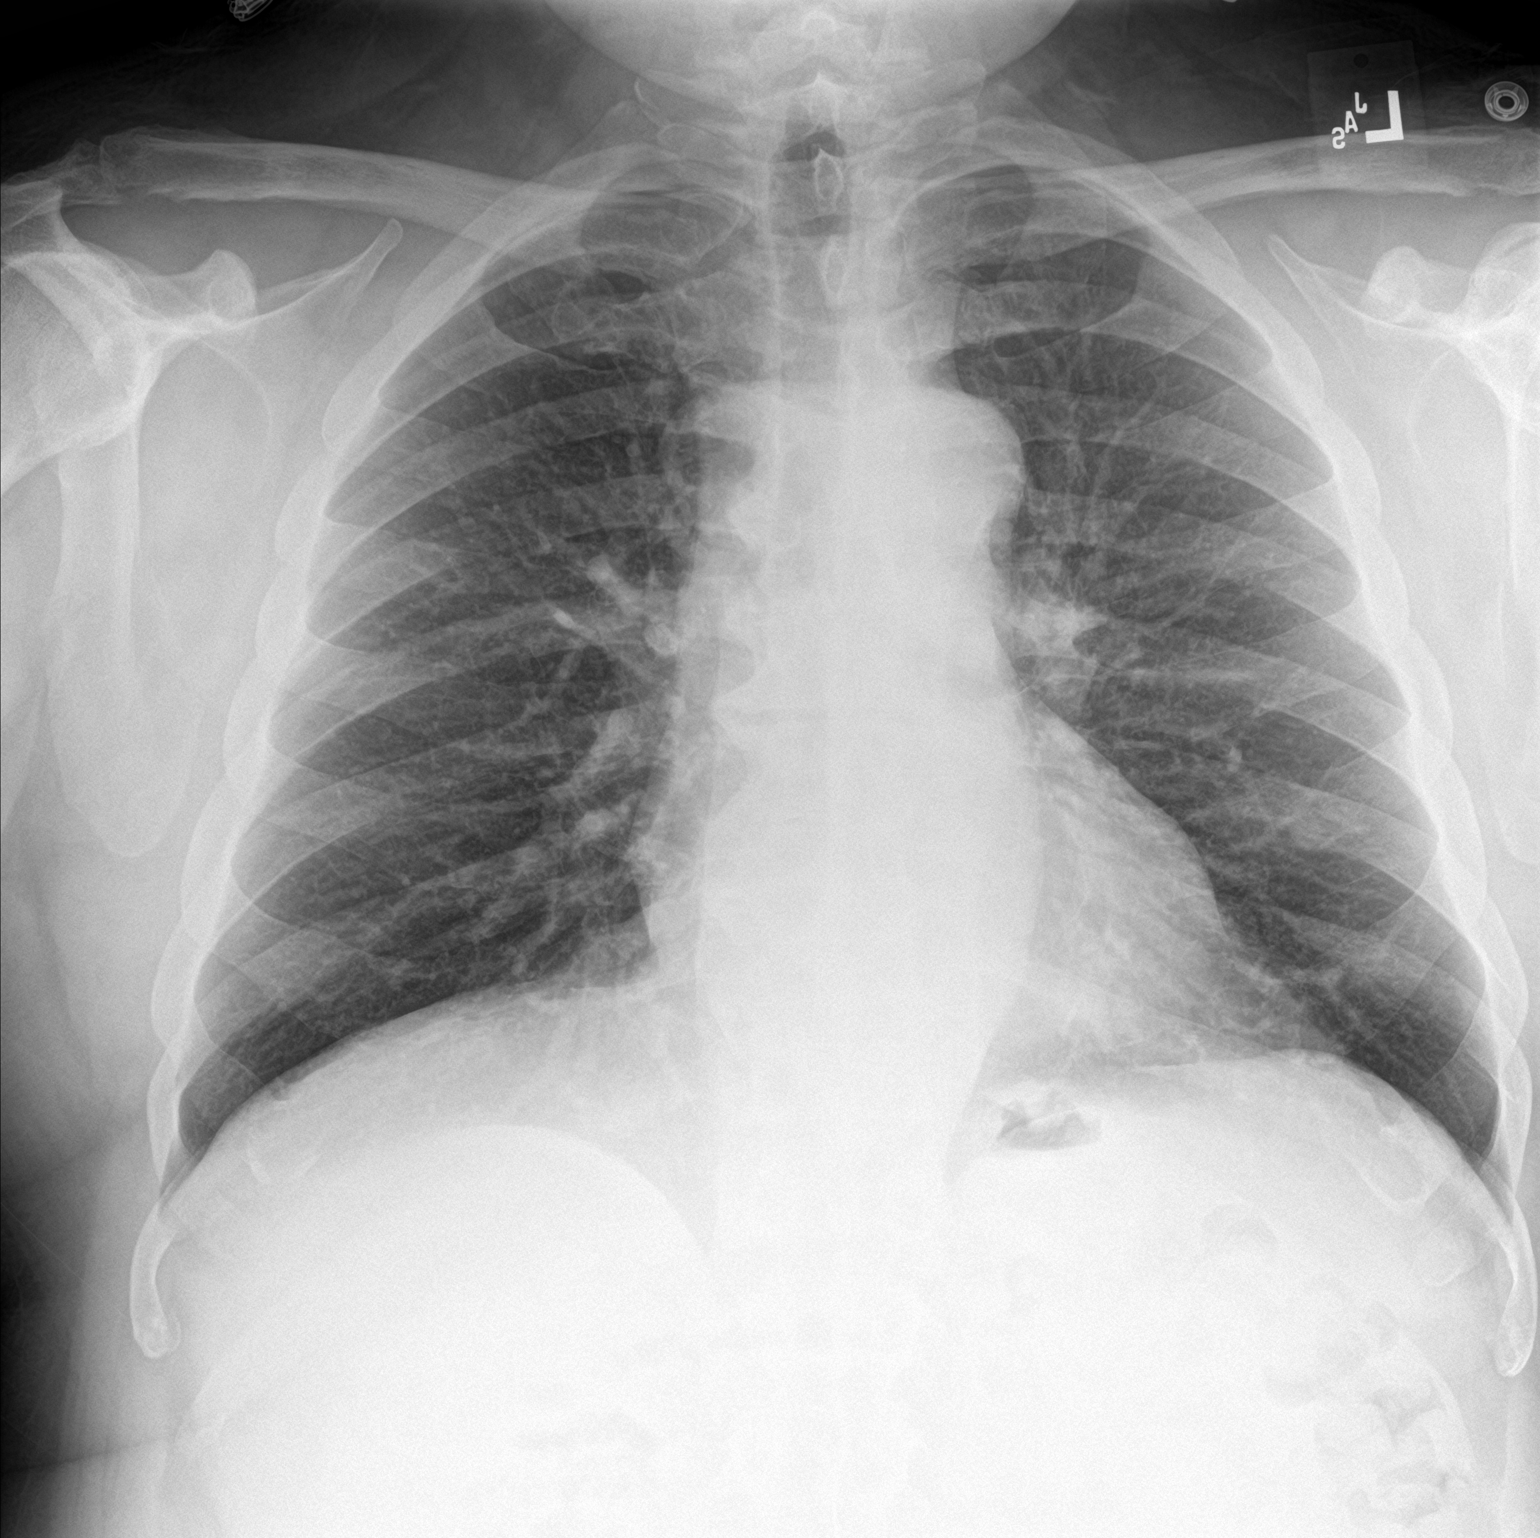

[chest lat]
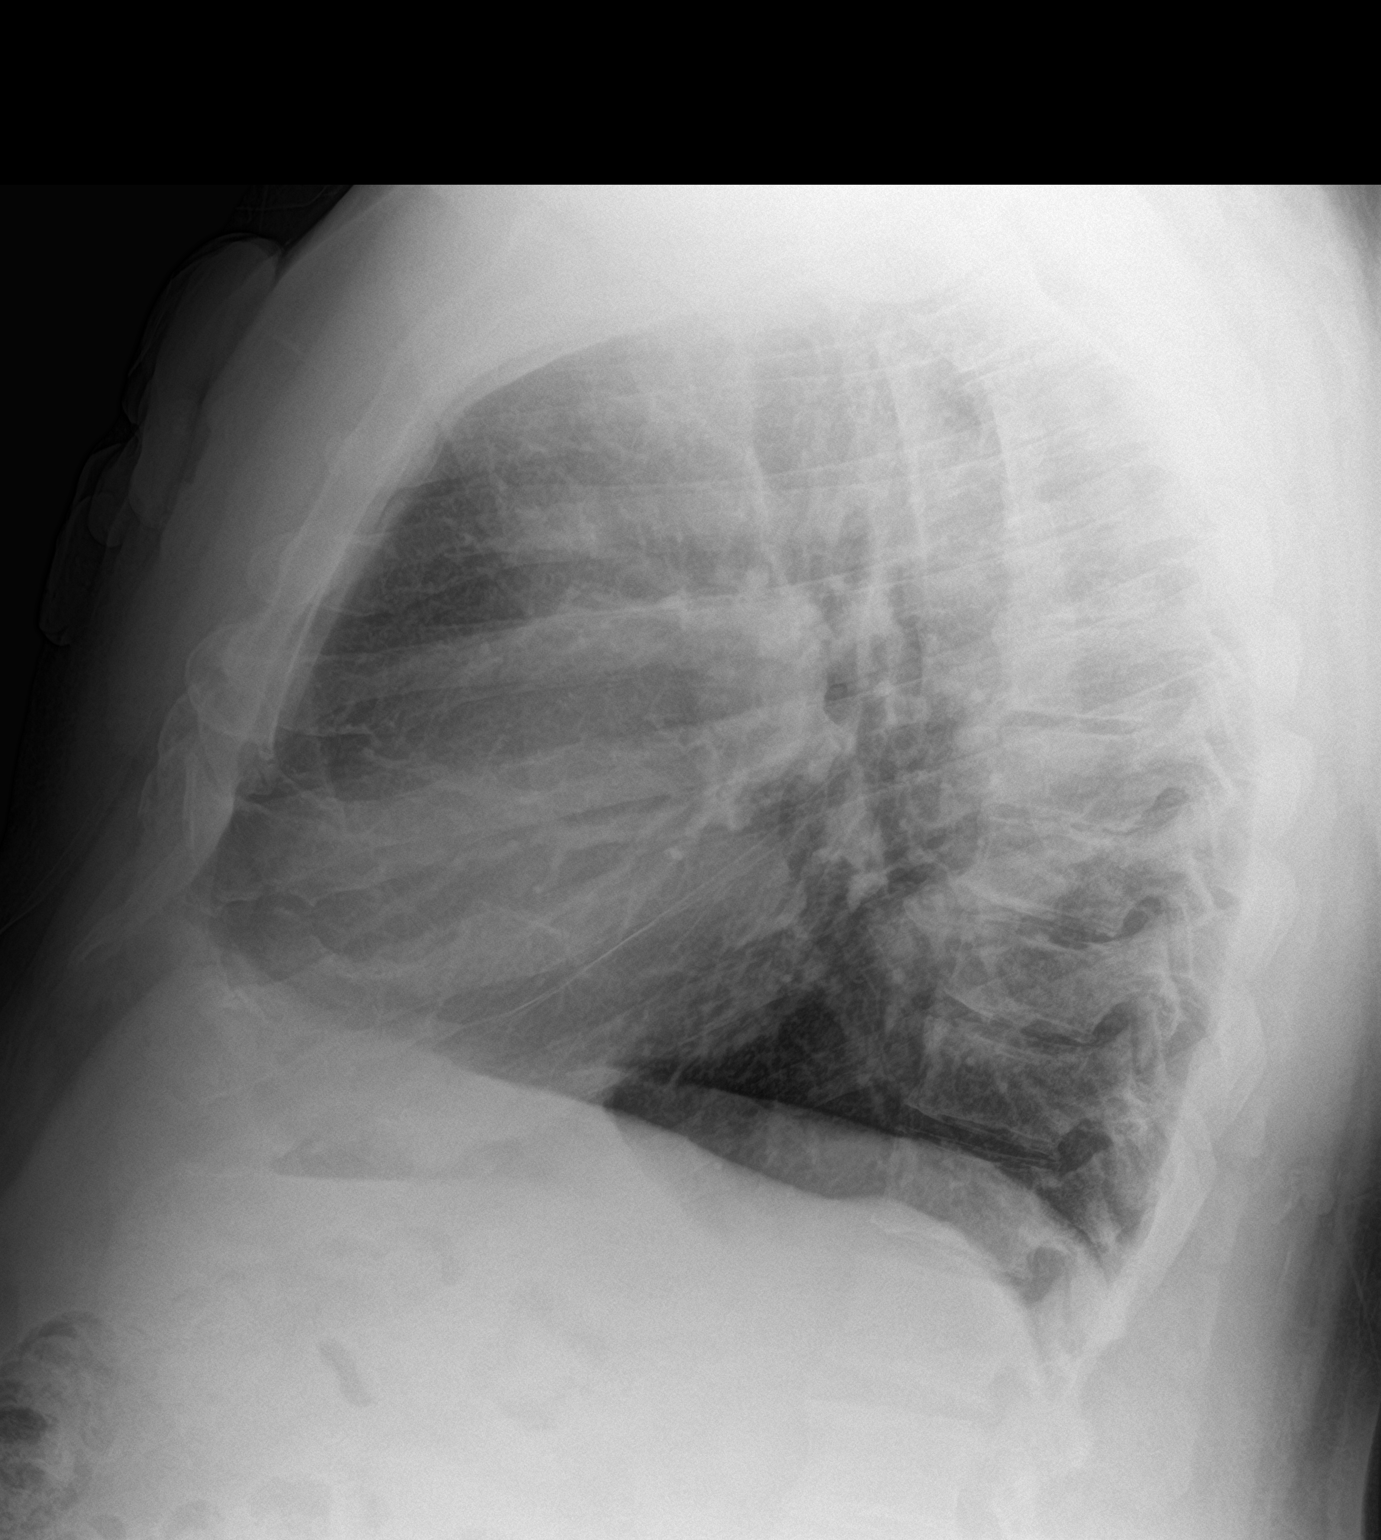

[2 of 2 positions shown; findings below may reference images not displayed]

FINDINGS: Interval removal of the nail projecting over the right lower lung.
No pneumothorax. No confluent opacities. Heart is normal size. No
effusions.
IMPRESSION: Interval removal of aspirated nail.  No acute findings.

## 2019-04-19 ENCOUNTER — Ambulatory Visit: Payer: BC Managed Care – PPO | Attending: Internal Medicine

## 2019-04-19 DIAGNOSIS — Z23 Encounter for immunization: Secondary | ICD-10-CM

## 2019-04-19 NOTE — Progress Notes (Signed)
   Covid-19 Vaccination Clinic  Name:  Joel [name]    MRN: [id number] DOB: 08/02/1955  04/19/2019  [name] was observed post Covid-19 immunization for 15 minutes without incident. He was provided with Vaccine Information Sheet and instruction to access the V-Safe system.   [name] was instructed to call 911 with any severe reactions post vaccine: Marland Kitchen Difficulty breathing  . Swelling of face and throat  . A fast heartbeat  . A bad rash all over body  . Dizziness and weakness   Immunizations Administered    Name Date Dose VIS Date Route   Pfizer COVID-19 Vaccine 04/19/2019  2:50 PM 0.3 mL 01/18/2019 Intramuscular   Manufacturer: [employer], [employer]   Lot: [id number]   NDC: [address]-7

## 2019-05-13 ENCOUNTER — Ambulatory Visit: Payer: BC Managed Care – PPO | Attending: Internal Medicine

## 2019-05-13 DIAGNOSIS — Z23 Encounter for immunization: Secondary | ICD-10-CM

## 2019-05-13 NOTE — Progress Notes (Signed)
   Covid-19 Vaccination Clinic  Name:  MAVERIK FOOT    MRN: [id number] DOB: Mar 05, 1955  05/13/2019  Mr. Hammen was observed post Covid-19 immunization for 15 minutes without incident. He was provided with Vaccine Information Sheet and instruction to access the V-Safe system.   Mr. Perin was instructed to call 911 with any severe reactions post vaccine: Marland Kitchen Difficulty breathing  . Swelling of face and throat  . A fast heartbeat  . A bad rash all over body  . Dizziness and weakness   Immunizations Administered    Name Date Dose VIS Date Route   Pfizer COVID-19 Vaccine 05/13/2019  4:58 PM 0.3 mL 01/18/2019 Intramuscular   Manufacturer: [employer], [employer]   Lot: [id number]   NDC: [address]-0

## 2021-04-12 ENCOUNTER — Ambulatory Visit (INDEPENDENT_AMBULATORY_CARE_PROVIDER_SITE_OTHER): Payer: Medicare HMO

## 2021-04-12 ENCOUNTER — Other Ambulatory Visit: Payer: Self-pay

## 2021-04-12 ENCOUNTER — Ambulatory Visit: Payer: Medicare HMO | Admitting: Internal Medicine

## 2021-04-12 ENCOUNTER — Encounter: Payer: Self-pay | Admitting: Internal Medicine

## 2021-04-12 DIAGNOSIS — R058 Other specified cough: Secondary | ICD-10-CM | POA: Insufficient documentation

## 2021-04-12 DIAGNOSIS — R0609 Other forms of dyspnea: Secondary | ICD-10-CM

## 2021-04-12 DIAGNOSIS — I10 Essential (primary) hypertension: Secondary | ICD-10-CM

## 2021-04-12 NOTE — Patient Instructions (Signed)
Continue protonix (pantoprazole) 40 mg Take 30- 60 min before your first and last meals of the day for at least 4 weeks then take it per prior GI doctor instructions ? ? GERD (REFLUX)  is an extremely common cause of respiratory symptoms just like yours , many times with no obvious heartburn at all.  ? ? It can be treated with medication, but also with lifestyle changes including elevation of the head of your bed (ideally with 6 -8inch blocks under the headboard of your bed),  Smoking cessation, avoidance of late meals, excessive alcohol, and avoid fatty foods, chocolate, peppermint, colas, red wine, and acidic juices such as orange juice.  ?NO MINT OR MENTHOL PRODUCTS SO NO COUGH DROPS  ?USE SUGARLESS CANDY INSTEAD (Jolley ranchers or Stover's or Life Savers) or even ice chips will also do - the key is to swallow to prevent all throat clearing. ?NO OIL BASED VITAMINS - use powdered substitutes.  Avoid fish oil when coughing.  ? ?Please remember to go to the  x-ray department  for your tests - we will call you with the results when they are available   ? ?We walk you today for a day  ? ?Call for follow up in 4 weeks if satisfied  ? ? ?

## 2021-04-12 NOTE — Progress Notes (Signed)
? ?Joel [name], male    DOB: Aug 13, 1955   MRN: [id number] ? ? ?Brief patient profile:  ?65 yobm? Asthma as child never took anything for it  played tight end/linebacker  quit smoking 1995  stopped running due to knee problems around 2012 @ 225 and covid June 2020 sick for week with shortness of breath and impaired self referred to pulmonary clinic 04/12/2021  for abn chest CT 07/2020 following covid 12/2018  and chronic cough since then improved "75%" since stopped smoking.  ? ? ? ?History of Present Illness  ?04/12/2021  Pulmonary/ 1st office eval/Joel [name]  ?Chief Complaint  ?Patient presents with  ? Consult  ?  Cough makes his lungs hurt  ?Dyspnea:  variable doe sometimes could not walk a behind a self propelled, sometimes could s pattern in terms of severity and since d/c acei no long having same problem  ?Cough: sense of something stuck in throat better since stopped ACEi  2 weeks/ min white  ?Sleep: was having to sleep propped up now down to < 20 vs prior to ACEi was like 45 degrees  ?SABA use: not using  ? ?No obvious day to day or daytime variability or assoc excess/ purulent sputum or mucus plugs or hemoptysis or cp or chest tightness, subjective wheeze or overt sinus or hb symptoms.  ? ?Sleeping better now  without nocturnal  or early am exacerbation  of respiratory  c/o's or need for noct saba. Also denies any obvious fluctuation of symptoms with weather or environmental changes or other aggravating or alleviating factors except as outlined above  ? ?No unusual exposure hx or h/o childhood pna/ asthma or knowledge of premature birth. ? ?Current Allergies, Complete Past Medical History, Past Surgical History, Family History, and Social History were reviewed in [employer] record. ? ?ROS  The following are not active complaints unless bolded ?Hoarseness, sore throat, dysphagia/globus, dental problems, itching, sneezing,  nasal congestion or discharge of excess mucus or purulent secretions,  ear ache,   fever, chills, sweats, unintended wt loss or wt gain, classically pleuritic or exertional cp,  orthopnea pnd or arm/hand swelling  or leg swelling, presyncope, palpitations, abdominal pain, anorexia, nausea, vomiting, diarrhea  or change in bowel habits or change in bladder habits, change in stools or change in urine, dysuria, hematuria,  rash, arthralgias, visual complaints, headache, numbness, weakness or ataxia or problems with walking or coordination,  change in mood or  memory. ?      ?   ? ?Past Medical History:  ?Diagnosis Date  ? Hypertension   ? ? ?Outpatient Medications Prior to Visit  ?Medication Sig Dispense Refill  ? albuterol (VENTOLIN HFA) 108 (90 Base) MCG/ACT inhaler SMARTSIG:2 Puff(s) By Mouth Every 6 Hours PRN    ? aspirin EC 325 MG tablet Take 325 mg by mouth daily as needed (headache).    ? EPINEPHrine 0.3 mg/0.3 mL IJ SOAJ injection Inject into the muscle.    ? finasteride (PROSCAR) 5 MG tablet Take 5 mg by mouth daily.    ? hydrochlorothiazide (HYDRODIURIL) 12.5 MG tablet Take 1 tablet by mouth daily.    ? Multiple Vitamin (MULTIVITAMIN WITH MINERALS) TABS tablet Take 1 tablet by mouth daily.    ? Omega-3 Fatty Acids (FISH OIL PO) Take 1 capsule by mouth daily.    ? pantoprazole (PROTONIX) 40 MG tablet Take by mouth.    ? sildenafil (VIAGRA) 100 MG tablet Take 1/2 to 1 tablet as needed.  Do not  use if taking nitrates for heart disease.    ? silodosin (RAPAFLO) 8 MG CAPS capsule Take by mouth.    ? valsartan (DIOVAN) 160 MG tablet Take 1 tablet (160 mg total) by mouth daily. 30 tablet 3  ? ?No facility-administered medications prior to visit.  ? ? ? ?Objective:  ?  ? ?BP 126/80 (BP Location: Left Arm, Patient Position: Sitting, Cuff Size: Normal)   Pulse 95   Temp 98.4 ?F (36.9 ?C) (Oral)   Ht 5\' 10"  (1.778 m)   Wt 275 lb (124.7 kg)   SpO2 98% Comment: ra  BMI 39.46 kg/m?  ? ?SpO2: 98 % (ra) ? ?Amb obese bm nad  ? ? HEENT : pt wearing mask not removed for exam due to covid -19  concerns.  ? ? ?NECK :  without JVD/Nodes/TM/ nl carotid upstrokes bilaterally ? ? ?LUNGS: no acc muscle use,  Nl contour chest which is clear to A and P bilaterally without cough on insp or exp maneuvers ? ? ?CV:  RRR  no s3 or murmur or increase in P2, and no edema  ? ?ABD:  soft and nontender with nl inspiratory excursion in the supine position. No bruits or organomegaly appreciated, bowel sounds nl ? ?MS:  Nl gait/ ext warm without deformities, calf tenderness, cyanosis or clubbing ?No obvious joint restrictions  ? ?SKIN: warm and dry without lesions   ? ?NEURO:  alert, approp, nl sensorium with  no motor or cerebellar deficits apparent.  ? ? ?CXR PA and Lateral:   04/12/2021 :    ?I personally reviewed images and agree with radiology impression as follows:    ?Slt reduced lung volumes, min non specific markings  ? ? ?   ?Assessment  ? ?No problem-specific Assessment & Plan notes found for this encounter. ? ? ? ? ?06/12/2021, MD ?04/12/2021 ?  ?

## 2021-04-12 NOTE — Assessment & Plan Note (Signed)
D/C  ACEi  Last dose was Feb 18th  2023  ?- 04/12/2021   Walked on RA  x  3  lap(s) =  approx 750  ft  @ rapid pace, stopped due to end of study with lowest 02 sats 95% and no sob  ? 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  Advised to call in 4 weeks if not 100% back to baseline to consider hrct and cpst next  ? ?

## 2021-04-12 NOTE — Assessment & Plan Note (Signed)
D/ c acei  Mar 27 2021 due to ? angiodedema  ? ?In the best review of chronic cough to date ( NEJM 2016 375 [phone]) ,  ACEi are now felt to cause cough in up to  20% of pts which is a 4 fold increase from previous reports and does not include the variety of non-specific complaints we see in pulmonary clinic in pts on ACEi but previously attributed to another dx like  Copd/asthma and  include PNDS, throat/ globus congestion, "bronchitis", unexplained dyspnea and noct "strangling" sensations, and hoarseness, but also  atypical /refractory GERD symptoms like dysphagia. ? ?The only way I know  to prove this is not an "ACEi Case" is a trial off ACEi x a minimum of 6 weeks then regroup.  ? ?He's already off acei and improving "75% so if not all better in 4 weeks will return for further w/u ? ?Each maintenance medication was reviewed in detail including emphasizing most importantly the difference between maintenance and prns and under what circumstances the prns are to be triggered using an action plan format where appropriate. ? ?Total time for H and P, chart review, counseling,  directly observing portions of ambulatory 02 saturation study/ and generating customized AVS unique to this office visit / same day charting  > 45 min  ?     ?  ?       ?

## 2021-04-12 NOTE — Assessment & Plan Note (Signed)
D/c acei Mar 27 2021 and max gerd rx rec 04/12/2021 >>>  ? ?Upper airway cough syndrome (previously labeled PNDS),  is so named because it's frequently impossible to sort out how much is  CR/sinusitis with freq throat clearing (which can be related to primary GERD)   vs  causing  secondary (" extra esophageal")  GERD from wide swings in gastric pressure that occur with throat clearing, often  promoting self use of mint and menthol lozenges that reduce the lower esophageal sphincter tone and exacerbate the problem further in a cyclical fashion.  ? ?These are the same pts (now being labeled as having "irritable larynx syndrome" by some cough centers) who not infrequently have a history of having failed to tolerate ace inhibitors,  dry powder inhalers or biphosphonates or report having atypical/extraesophageal reflux symptoms that don't respond to standard doses of PPI  and are easily confused as having aecopd or asthma flares by even experienced allergists/ pulmonologists (myself included).  ? ? ?rec off acei and max rx for GERD and f/u in 4 weeks if not all better ?

## 2021-06-04 ENCOUNTER — Ambulatory Visit: Payer: Medicare HMO | Admitting: Internal Medicine

## 2021-06-04 ENCOUNTER — Encounter: Payer: Self-pay | Admitting: Internal Medicine

## 2021-06-04 DIAGNOSIS — I10 Essential (primary) hypertension: Secondary | ICD-10-CM

## 2021-06-04 DIAGNOSIS — R0609 Other forms of dyspnea: Secondary | ICD-10-CM

## 2021-06-04 NOTE — Progress Notes (Signed)
? ?Joel [name], male    DOB: 09/10/1955   MRN: [id number] ? ? ?Brief patient profile:  ?65 yobm? Asthma as child never took anything for it  played tight end/linebacker  quit smoking 1995  stopped running due to knee problems around 2012 @ 225 and covid June 2020 sick for week with shortness of breath and impaired self referred to pulmonary clinic 04/12/2021  for abn chest CT 07/2020 following covid 12/2018  and chronic cough since then improved "75%" since stopped smoking.  ? ? ? ?History of Present Illness  ?04/12/2021  Pulmonary/ 1st office eval/Kelly Eisler  ?Chief Complaint  ?Patient presents with  ? Consult  ?  Cough makes his lungs hurt  ?Dyspnea:  variable doe sometimes could not walk a behind a self propelled, sometimes could s pattern in terms of severity and since d/c acei no longer having same problem  ?Cough: sense of something stuck in throat better since stopped ACEi  2 weeks/ min white  ?Sleep: was having to sleep propped up now down to < 20 vs prior to ACEi was like 45 degrees  ?SABA use: not using  ?Rec ?Continue protonix (pantoprazole) 40 mg Take 30- 60 min before your first and last meals of the day for at least 4 weeks then take it per prior GI doctor instructions ?GERD diet ? ? ?06/04/2021  f/u ov/Marcquis Ridlon re: uacs   maint on ppi  ?Chief Complaint  ?Patient presents with  ? Follow-up  ?  Breathing is doing well. He has not needed to use his albuterol. No new co's.   ?Dyspnea: MMRC1 = can walk nl pace, flat grade, can't hurry or go uphills or steps s sob   Zettie Pho ?Cough: resolved  ?Sleeping: ok with 10 degrees hob elevation  ?SABA use: none  ?02: none ?  ? ? ?No obvious day to day or daytime variability or assoc excess/ purulent sputum or mucus plugs or hemoptysis or cp or chest tightness, subjective wheeze or overt sinus or hb symptoms.  ? ?Sleeping  without nocturnal  or early am exacerbation  of respiratory  c/o's or need for noct saba. Also denies any obvious fluctuation of symptoms with weather or  environmental changes or other aggravating or alleviating factors except as outlined above  ? ?No unusual exposure hx or h/o childhood pna/ asthma or knowledge of premature birth. ? ?Current Allergies, Complete Past Medical History, Past Surgical History, Family History, and Social History were reviewed in [employer] record. ? ?ROS  The following are not active complaints unless bolded ?Hoarseness, sore throat, dysphagia, dental problems, itching, sneezing,  nasal congestion or discharge of excess mucus or purulent secretions, ear ache,   fever, chills, sweats, unintended wt loss or wt gain, classically pleuritic or exertional cp,  orthopnea pnd or arm/hand swelling  or leg swelling, presyncope, palpitations, abdominal pain, anorexia, nausea, vomiting, diarrhea  or change in bowel habits or change in bladder habits, change in stools or change in urine, dysuria, hematuria,  rash, arthralgias, visual complaints, headache, numbness, weakness or ataxia or problems with walking or coordination,  change in mood or  memory. ?      ? ?Current Meds  ?Medication Sig  ? albuterol (VENTOLIN HFA) 108 (90 Base) MCG/ACT inhaler SMARTSIG:2 Puff(s) By Mouth Every 6 Hours PRN  ? aspirin EC 325 MG tablet Take 325 mg by mouth daily as needed (headache).  ? EPINEPHrine 0.3 mg/0.3 mL IJ SOAJ injection Inject into the muscle.  ? finasteride (  PROSCAR) 5 MG tablet Take 5 mg by mouth daily.  ? hydrochlorothiazide (HYDRODIURIL) 12.5 MG tablet Take 1 tablet by mouth daily.  ? Multiple Vitamin (MULTIVITAMIN WITH MINERALS) TABS tablet Take 1 tablet by mouth daily.  ? Omega-3 Fatty Acids (FISH OIL PO) Take 1 capsule by mouth daily.  ? pantoprazole (PROTONIX) 40 MG tablet Take by mouth.  ? sildenafil (VIAGRA) 100 MG tablet Take 1/2 to 1 tablet as needed.  Do not use if taking nitrates for heart disease.  ? silodosin (RAPAFLO) 8 MG CAPS capsule Take by mouth.  ? valsartan (DIOVAN) 160 MG tablet Take 1 tablet (160 mg total)  by mouth daily.  ?    ?  ?      ?   ? ?Past Medical History:  ?Diagnosis Date  ? Hypertension   ? ?  ? ? ?Objective:  ?  ? ?  ?Wt Readings from Last 3 Encounters:  ?06/04/21 275 lb (124.7 kg)  ?04/12/21 275 lb (124.7 kg)  ?03/04/16 270 lb 15.1 oz (122.9 kg)  ?  ? ? ?Vital signs reviewed  06/04/2021  - Note at rest 02 sats  97% on RA  ? ?General appearance:    amb obese bm nad   ? ? HEENT : nl exam  ? ? ?NECK :  without JVD/Nodes/TM/ nl carotid upstrokes bilaterally ? ? ?LUNGS: no acc muscle use,  Nl contour chest which is clear to A and P bilaterally without cough on insp or exp maneuvers ? ? ?CV:  RRR  no s3 or murmur or increase in P2, and no edema  ? ?ABD:  obese soft and nontender with nl inspiratory excursion in the supine position. No bruits or organomegaly appreciated, bowel sounds nl ? ?MS:  Nl gait/ ext warm without deformities, calf tenderness, cyanosis or clubbing ?No obvious joint restrictions  ? ?SKIN: warm and dry without lesions   ? ?NEURO:  alert, approp, nl sensorium with  no motor or cerebellar deficits apparent.   ? ? ? ?I personally reviewed images and agree with radiology impression as follows:  ?CXR:   pa and lat  04/12/21  ?1. No definite post COVID sequela. ?2. Chronic hyperinflation with mild bronchial thickening. ?   ?Assessment  ? ?  ?

## 2021-06-04 NOTE — Patient Instructions (Signed)
To get the most out of exercise, you need to be continuously aware that you are short of breath, but never out of breath, for at least 30 minutes daily. As you improve, it will actually be easier for you to do the same amount of exercise  in  30 minutes so always push to the level where you are short of breath.   ? ?We will walk you today for a baseline  ? ?We will schedule you for pfts and I will call you with the results and whether follow up here is needed. ?  ?

## 2021-06-06 ENCOUNTER — Encounter: Payer: Self-pay | Admitting: Internal Medicine

## 2021-06-06 NOTE — Assessment & Plan Note (Signed)
D/ c acei  Mar 27 2021 due to ? Angiodedema/cough   ? ?Although even in retrospect it may not be clear the ACEi contributed to the pt's symptoms,  Pt improved off them and adding them back at this point or in the future would risk confusion in interpretation of non-specific respiratory symptoms to which this patient is prone  ie  Better not to muddy the waters here.  ? ?>>> bp ok on diovan 160 mg daily , tol well > no change rx and f/u PCP  ? ?Each maintenance medication was reviewed in detail including emphasizing most importantly the difference between maintenance and prns and under what circumstances the prns are to be triggered using an action plan format where appropriate. ? ?Total time for H and P, chart review, counseling,  directly observing portions of ambulatory 02 saturation study/ and generating customized AVS unique to this office visit / same day charting > 30 min  ?     ?  ?       ?

## 2021-06-06 NOTE — Assessment & Plan Note (Signed)
D/C  ACEi  Last dose was Feb 18th  2023  ?- 04/12/2021   Walked on RA  x  3  lap(s) =  approx 750  ft  @ rapid pace, stopped due to end of study with lowest 02 sats 95% and no sob  ?- 06/04/2021   Walked on RA  x  3  lap(s) =  approx 750  ft  @ mod fast pace, stopped due to end of study, no sob with lowest 02 sats 94%  ? ?Clearly improved off acei and on gerd rx so rec continue sub max ex up to 30 min daily if possible and f/u with PFTs to complete the w/u  ?

## 2021-07-12 ENCOUNTER — Other Ambulatory Visit: Payer: Self-pay | Admitting: Internal Medicine

## 2021-07-12 ENCOUNTER — Ambulatory Visit (INDEPENDENT_AMBULATORY_CARE_PROVIDER_SITE_OTHER): Payer: Medicare HMO | Admitting: Internal Medicine

## 2021-07-12 DIAGNOSIS — R0609 Other forms of dyspnea: Secondary | ICD-10-CM

## 2021-07-12 LAB — PULMONARY FUNCTION TEST
DL/VA % pred: 122 %
DL/VA: 5.06 ml/min/mmHg/L
DLCO cor % pred: 114 %
DLCO cor: 30.65 ml/min/mmHg
DLCO unc % pred: 114 %
DLCO unc: 30.65 ml/min/mmHg
FEF 25-75 Post: 4.24 L/sec
FEF 25-75 Pre: 3.81 L/sec
FEF2575-%Change-Post: 11 %
FEF2575-%Pred-Post: 157 %
FEF2575-%Pred-Pre: 141 %
FEV1-%Change-Post: 3 %
FEV1-%Pred-Post: 116 %
FEV1-%Pred-Pre: 112 %
FEV1-Post: 3.5 L
FEV1-Pre: 3.38 L
FEV1FVC-%Change-Post: 2 %
FEV1FVC-%Pred-Pre: 108 %
FEV6-%Change-Post: 1 %
FEV6-%Pred-Post: 108 %
FEV6-%Pred-Pre: 107 %
FEV6-Post: 4.1 L
FEV6-Pre: 4.04 L
FEV6FVC-%Change-Post: 0 %
FEV6FVC-%Pred-Post: 104 %
FEV6FVC-%Pred-Pre: 104 %
FVC-%Change-Post: 1 %
FVC-%Pred-Post: 104 %
FVC-%Pred-Pre: 102 %
FVC-Post: 4.1 L
FVC-Pre: 4.04 L
Post FEV1/FVC ratio: 85 %
Post FEV6/FVC ratio: 100 %
Pre FEV1/FVC ratio: 84 %
Pre FEV6/FVC Ratio: 100 %

## 2021-07-12 NOTE — Progress Notes (Signed)
PFT done today. 

## 2021-07-19 NOTE — Progress Notes (Signed)
ATC, NA and no option to leave msg, WCB

## 2023-08-25 ENCOUNTER — Encounter: Payer: Self-pay | Admitting: Advanced Practice Midwife

## 2023-09-28 IMAGING — DX DG CHEST 2V
2 series · 2 of 2 positions shown · non-contrast
Comparison: Most recent radiograph 02/02/2017

CLINICAL DATA: Cough after COVID.  Dyspnea on exertion.

EXAM:
CHEST - 2 VIEW

[chest pa]
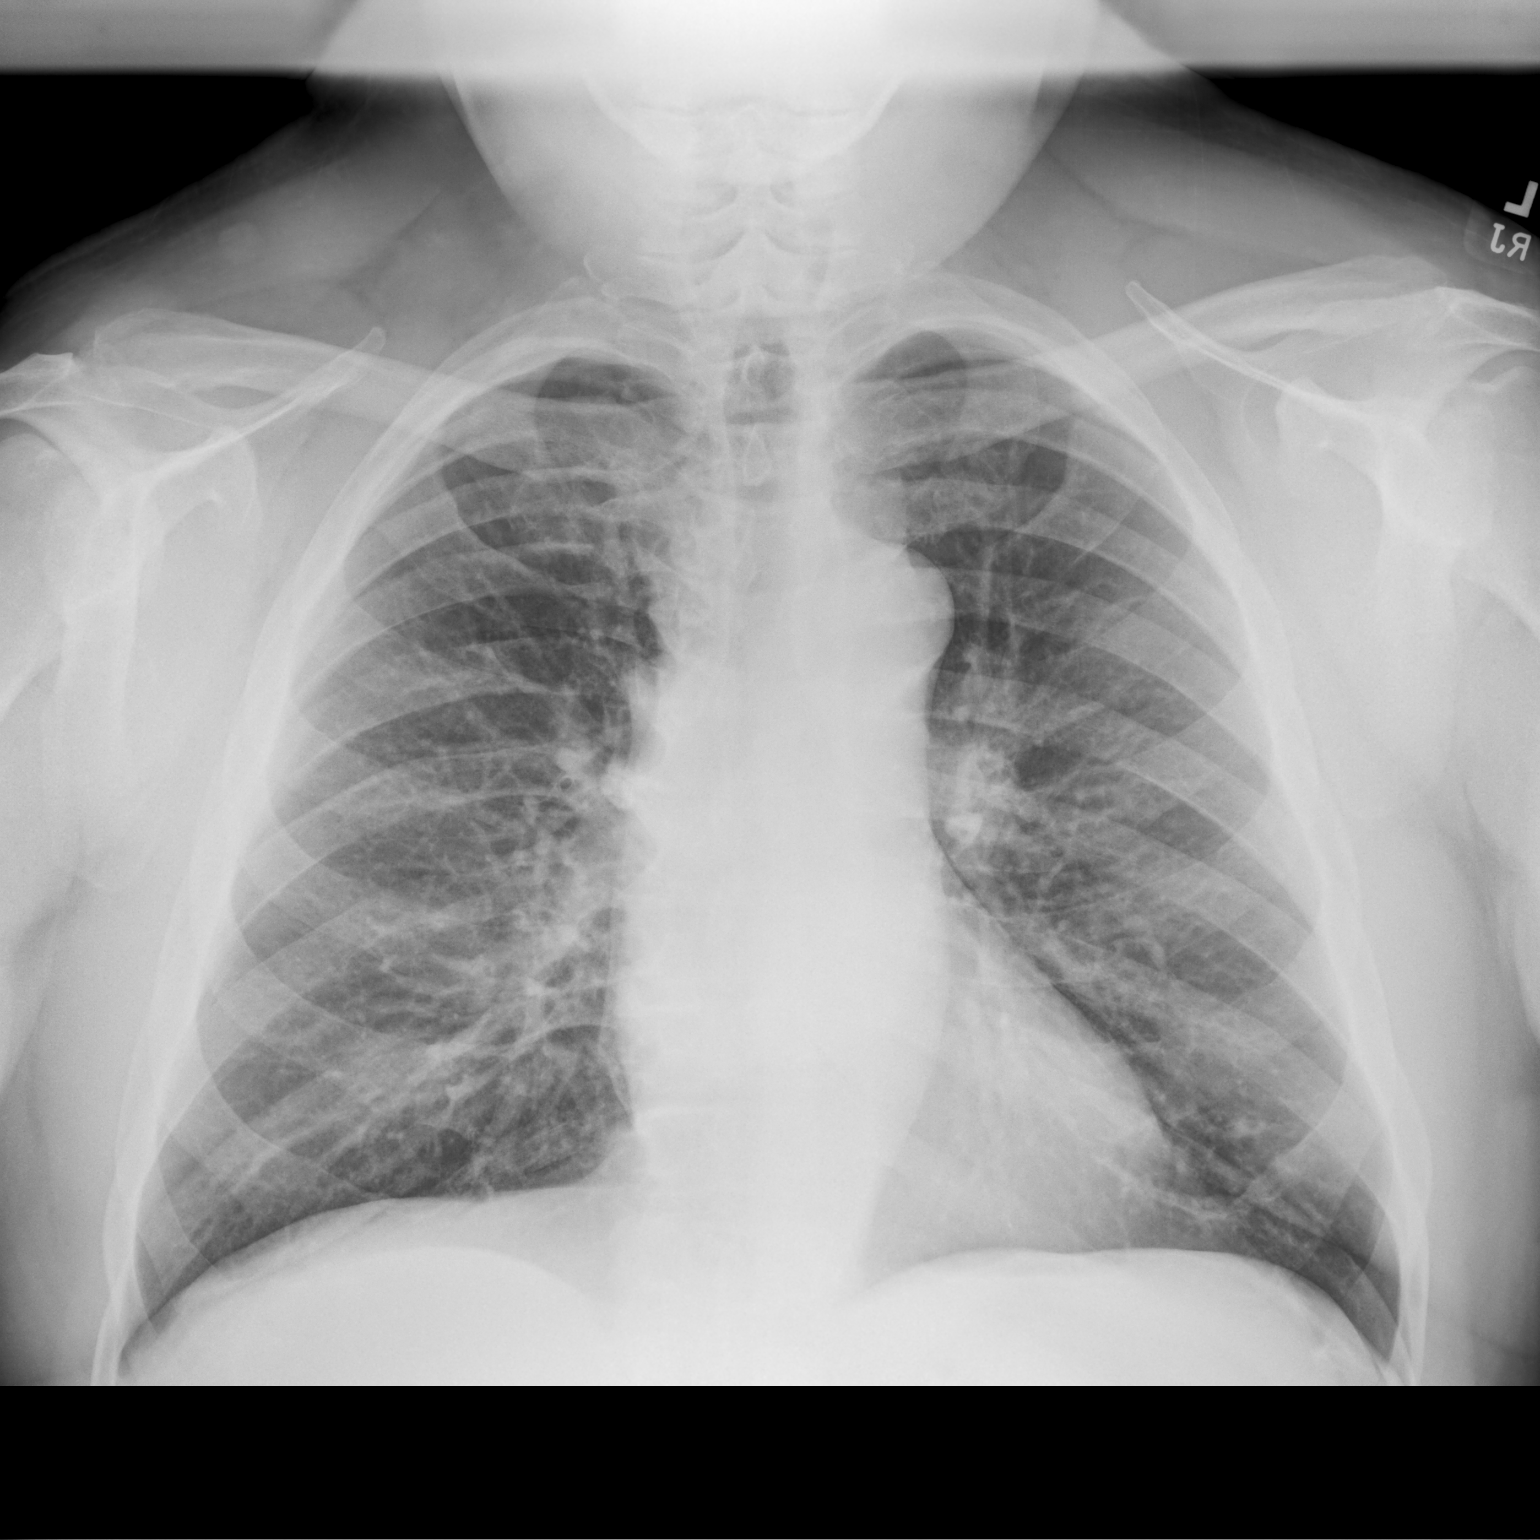

[chest lat]
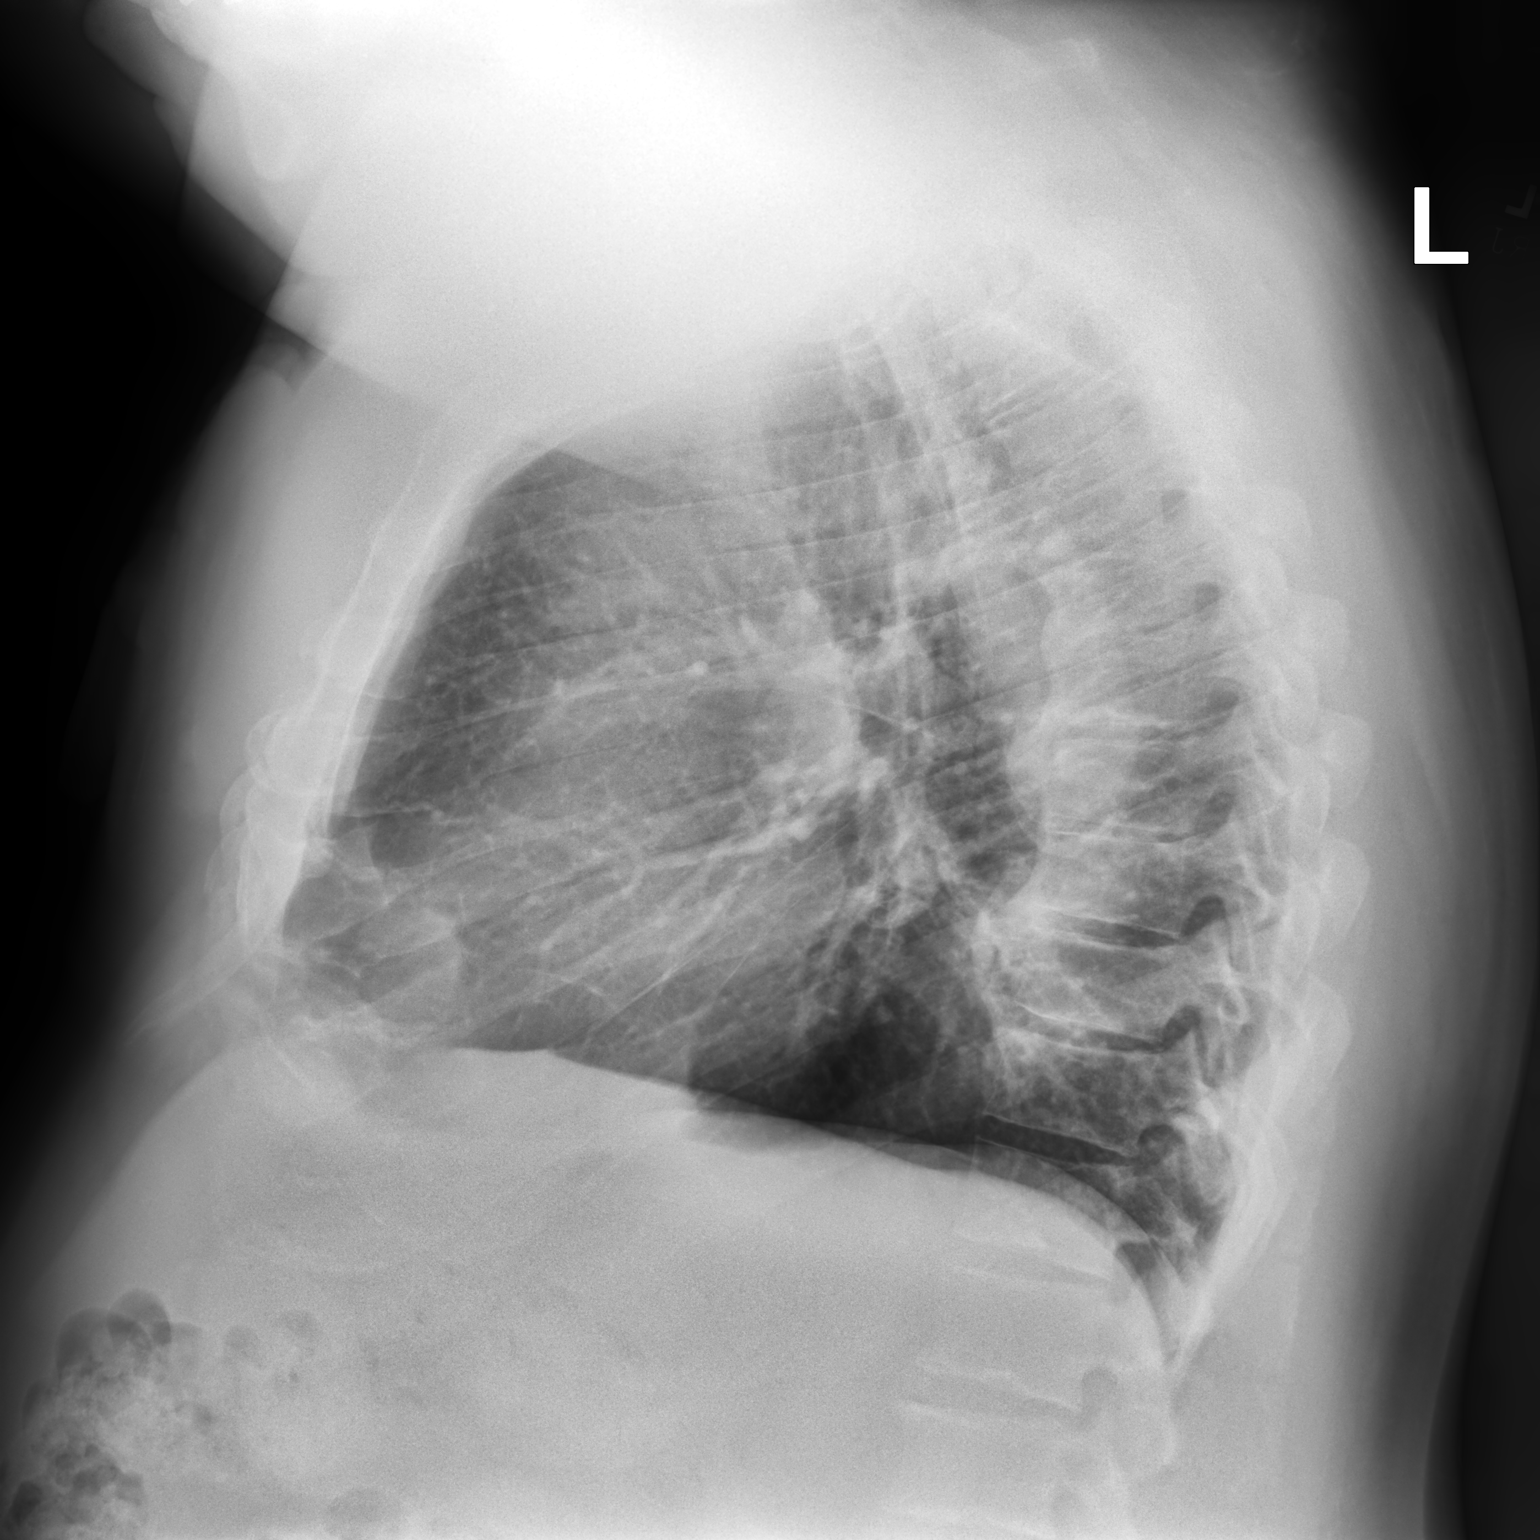

[2 of 2 positions shown; findings below may reference images not displayed]

FINDINGS: The cardiomediastinal contours are normal. Mild peribronchial
thickening with chronic hyperinflation. Pulmonary vasculature is
normal. No consolidation, pleural effusion, or pneumothorax. No
findings of pulmonary fibrosis. No acute osseous abnormalities are
seen.
IMPRESSION: 1. No definite post COVID sequela.
2. Chronic hyperinflation with mild bronchial thickening.
